# Patient Record
Sex: Female | Born: 1940 | Race: White | Hispanic: No | Marital: Married | Smoking: Former smoker
Health system: Southern US, Community
[De-identification: ages and names within clinical notes are randomized; demographics above are authoritative.]

## PROBLEM LIST (undated history)

## (undated) DIAGNOSIS — E7800 Pure hypercholesterolemia, unspecified: Secondary | ICD-10-CM

## (undated) DIAGNOSIS — I10 Essential (primary) hypertension: Secondary | ICD-10-CM

## (undated) HISTORY — PX: EYE SURGERY: SHX253

## (undated) HISTORY — PX: DILATION AND CURETTAGE OF UTERUS: SHX78

## (undated) HISTORY — PX: TONSILLECTOMY AND ADENOIDECTOMY: SUR1326

## (undated) HISTORY — PX: TUBAL LIGATION: SHX77

## (undated) HISTORY — PX: PYLOROPLASTY: SHX418

## (undated) HISTORY — PX: RETINAL DETACHMENT SURGERY: SHX105

## (undated) HISTORY — PX: CATARACT EXTRACTION, BILATERAL: SHX1313

## (undated) HISTORY — PX: OTHER SURGICAL HISTORY: SHX169

---

## 1992-05-12 ENCOUNTER — Inpatient Hospital Stay
Admission: AD | Admit: 1992-05-12 | Disposition: A | Discharge status: Home / Self Care | Payer: Self-pay | Source: Other Acute Inpatient Hospital

## 2005-08-01 ENCOUNTER — Ambulatory Visit
Admission: RE | Admit: 2005-08-01 | Disposition: A | Discharge status: Home / Self Care | Payer: Self-pay | Source: Ambulatory Visit

## 2006-08-30 ENCOUNTER — Ambulatory Visit
Admission: RE | Admit: 2006-08-30 | Disposition: A | Discharge status: Home / Self Care | Payer: Self-pay | Source: Ambulatory Visit

## 2007-08-21 ENCOUNTER — Ambulatory Visit
Admission: RE | Admit: 2007-08-21 | Disposition: A | Discharge status: Home / Self Care | Payer: Self-pay | Source: Ambulatory Visit

## 2007-09-11 ENCOUNTER — Ambulatory Visit
Admission: RE | Admit: 2007-09-11 | Disposition: A | Discharge status: Home / Self Care | Payer: Self-pay | Source: Ambulatory Visit

## 2009-09-06 ENCOUNTER — Ambulatory Visit
Admission: RE | Admit: 2009-09-06 | Disposition: A | Discharge status: Home / Self Care | Payer: Self-pay | Source: Ambulatory Visit

## 2011-05-30 HISTORY — PX: SKIN CANCER EXCISION: SHX779

## 2012-03-11 ENCOUNTER — Ambulatory Visit
Admission: RE | Admit: 2012-03-11 | Disposition: A | Discharge status: Home / Self Care | Payer: Self-pay | Source: Ambulatory Visit

## 2012-10-24 ENCOUNTER — Encounter (RURAL_HEALTH_CENTER): Payer: Self-pay | Admitting: Family Medicine

## 2012-10-24 DIAGNOSIS — Q400 Congenital hypertrophic pyloric stenosis: Secondary | ICD-10-CM

## 2012-10-24 DIAGNOSIS — M899 Disorder of bone, unspecified: Secondary | ICD-10-CM | POA: Insufficient documentation

## 2012-10-24 DIAGNOSIS — Z860100 Personal history of colon polyps, unspecified: Secondary | ICD-10-CM

## 2012-10-24 DIAGNOSIS — I10 Essential (primary) hypertension: Secondary | ICD-10-CM

## 2012-10-24 DIAGNOSIS — H40019 Open angle with borderline findings, low risk, unspecified eye: Secondary | ICD-10-CM

## 2012-10-24 DIAGNOSIS — M949 Disorder of cartilage, unspecified: Secondary | ICD-10-CM

## 2012-10-24 DIAGNOSIS — E785 Hyperlipidemia, unspecified: Secondary | ICD-10-CM

## 2012-10-24 DIAGNOSIS — Z01419 Encounter for gynecological examination (general) (routine) without abnormal findings: Secondary | ICD-10-CM

## 2012-10-24 DIAGNOSIS — J309 Allergic rhinitis, unspecified: Secondary | ICD-10-CM

## 2012-10-24 DIAGNOSIS — R059 Cough, unspecified: Secondary | ICD-10-CM

## 2012-10-24 HISTORY — DX: Cough, unspecified: R05.9

## 2012-10-24 HISTORY — DX: Disorder of cartilage, unspecified: M94.9

## 2012-10-24 HISTORY — DX: Disorder of bone, unspecified: M89.9

## 2012-10-24 HISTORY — DX: Encounter for gynecological examination (general) (routine) without abnormal findings: Z01.419

## 2012-10-24 HISTORY — DX: Congenital hypertrophic pyloric stenosis: Q40.0

## 2012-10-24 HISTORY — DX: Hyperlipidemia, unspecified: E78.5

## 2012-10-24 HISTORY — DX: Allergic rhinitis, unspecified: J30.9

## 2012-10-24 HISTORY — DX: Personal history of colonic polyps: Z86.010

## 2012-10-24 HISTORY — DX: Essential (primary) hypertension: I10

## 2012-10-24 HISTORY — DX: Open angle with borderline findings, low risk, unspecified eye: H40.019

## 2012-10-24 HISTORY — DX: Personal history of colon polyps, unspecified: Z86.0100

## 2013-06-18 ENCOUNTER — Encounter (INDEPENDENT_AMBULATORY_CARE_PROVIDER_SITE_OTHER): Payer: Self-pay | Admitting: Orthopaedic Surgery

## 2013-06-18 ENCOUNTER — Ambulatory Visit (INDEPENDENT_AMBULATORY_CARE_PROVIDER_SITE_OTHER): Payer: Medicare PPO | Admitting: Orthopaedic Surgery

## 2013-06-18 VITALS — BP 136/62 | HR 71 | Ht 63.0 in | Wt 150.0 lb

## 2013-06-18 DIAGNOSIS — M25511 Pain in right shoulder: Secondary | ICD-10-CM

## 2013-06-18 DIAGNOSIS — M25819 Other specified joint disorders, unspecified shoulder: Secondary | ICD-10-CM

## 2013-06-18 DIAGNOSIS — M67919 Unspecified disorder of synovium and tendon, unspecified shoulder: Secondary | ICD-10-CM

## 2013-06-18 MED ORDER — METHYLPREDNISOLONE ACETATE 80 MG/ML IJ SUSP
80.0000 mg | Freq: Once | INTRAMUSCULAR | Status: AC
Start: 2013-06-18 — End: 2013-06-18
  Administered 2013-06-18: 80 mg via INTRAMUSCULAR

## 2013-06-18 MED ORDER — LIDOCAINE HCL 1 % IJ SOLN
2.0000 mL | Freq: Once | INTRAMUSCULAR | Status: AC
Start: 2013-06-18 — End: 2013-06-18
  Administered 2013-06-18: 2 mL via INTRA_ARTICULAR

## 2013-06-18 NOTE — Progress Notes (Signed)
[facility]  Office Note    Demographics:      Date Time: 06/18/2013 9:59 AM  Patient Name: [name], [name]  DoB: 1941-03-10  Age: 73 y.o.   PCP: No primary provider on file.    History of Present Illness:      Chief Complaint   Patient presents with   . Shoulder Pain     New patient right shoulder pain.  NO known injury.  Pain started in July.  Had an injection in August for Bursitis that really didn't help.  Was having pain in the upper arm but now it is just in the shoulder area.  pain with movement.       Patient is a 73 y.o. female that presents in the office today with a history of right shoulder pain. The shoulder pain began with in July of this year after she washed her driveway with a pressure washer and had her R shoulder in the internal rotation position for a while, continued to have pain a month after and went to a medical clinic and was diagnosed with bursitis and had a steroid shot which did not help. Patient now states that her pain is much improved and only hurst in certain positions now. Patient had night pain at the beginning but does not now. Patient currently rates pain at 3 out of 10. On exam she has a classic impingement.    Patient is a 73 y.o. female that presents in the office today with a history of R shoulder pain.    Pain quality:     x  Aching       Burning        Cramping       Shooting       Stabbing  Pain Level:  Pain Score: 3-mild pain out of 10.  Pain Course:      Constant    x   Fluctuating       Improving       Worsening       Intermittent  Associated Symptoms:      Numbness       Weakness       Decreased range of motion          Stiffness    x   Pain with activity    x   Night pain       Locking and catching  Treatments:      Rest     x  Ice     x  Heat    x   Tylenol   x   Antiinflammatories      Medical History:      Past Medical History   Diagnosis Date   . Essential hypertension, benign 10/24/2012   . Other and unspecified hyperlipidemia 10/24/2012   . Open  angle with borderline findings, low risk 10/24/2012   . Disorder of bone and cartilage, unspecified 10/24/2012   . Personal history of colonic polyps 10/24/2012   . 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 Routine gynecological examination 10/24/2012   . Allergic rhinitis, cause unspecified 10/24/2012   . Cough 10/24/2012      Past Surgical History   Procedure Date   . Cataract extraction, bilateral      WITH LENS INSERTION   . Dilation and curettage of uterus      MISCARRIAGE   . Skin cancer excision 2013     BACK   . Pyloroplasty  PYLORIC STENOSIS, 72 WEEKS OLD   . Retinal detachment surgery    . Tonsillectomy and adenoidectomy    . Tubal ligation    . Tubal ligation reversal         (Not in a hospital admission)  No Known Allergies   History   Substance Use Topics   . Smoking status: Never Smoker    . Smokeless tobacco: Not on file   . Alcohol Use: Yes      Comment: OCCASIONAL      Family History   Problem Relation Age of Onset   . Heart disease Mother         Review of Systems:      Ten point review of systems:     Constitution: Negative  weight change, fatigue, activity change and fever.  HEENT: Negative hearing loss, sinus pressure, dental problems and visual disturbance.  Respiratory: Negative apnea, shortness of breath and cough.  CV: Negative chest pain, bilateral leg swelling and palpatations.  GI: Negative abdominal pain, constipation, gurd and diarrhea.  GU: Negative frequency, difficulty urinating, flank pain and urgency.  Skin: Negative rash and new wounds.  HEM: Negative bruises easily, and clots easily  Neuro:Negative numbness, headache and weakness.  XB:[id number] for R shoulder pain.   Psych: Negative confusion, hyperactivity, nervous.anxious and sleep disturbance.    Objective:      Vital Signs: BP 136/62  Pulse 71  Ht 1.6 m (5\' 3" )  Wt 68.04 kg (150 lb)  BMI 26.58 kg/m2     Neurologic: A/O x 3, + light touch sensation in the extremity. + DTR     Integumentary: Shoulder is clean, dry,  skin is intact. No signs of infection.  Swelling none.       Shoulder Exam: FROM in the elbow and cervical spine.  Pain with AROM  ROM: Forward Flexion 180             Abduction 180  No pain over the anterior capsule of the shoulder  No pain over the [facility] joint  Painful on external rotation with positive impingement sign  5/5 strength in the bicep and tricep  No pain over the long head of the bicep tendon  No pain over the trapezius                                       Imaging Review:      X-rays of the right shoulder were ordered in the office.  Images ordered in the office were AP, lateral and axial.  Indication is gradual onset of pain  I went over the images in the office with the patient.  My overall impression is normal shoulder x-ray.     Assessment:      1. Right shoulder pain         Plan:      The patient presented to the office today with  presents in the office today with right shoulder pain.  I discussed the clinical exam and imaging studies with the patient.  Based on the patient's presentation, examination and imaging, I believe the patient has , and would benefit from any of the following treatment options: cortisone injection and Physical Therapy.  The patient has already tried rest, ice, anti inflammatories and cortisone injection, and has opted to proceed forward with cortisone injection and Physical Therapy.    Procedural Note:  Patient's name and date of birth were verified, site of injection was verified, verified that all of the correct equipment was in the room, consent was completed.  Then the shoulder was prepped with hibiclens, prenumbed with ethyl chloride spray, then one cc lidocaine, one cc marcaine and 80 mg Depo Medrol were injected into the subacromial portion of the shoulder under sterile technique with no complications. The patient was monitored in the clinic for side effects of the injection, none were noted.  Patient tolerated the injection well.     No orders of the defined  types were placed in this encounter.      - MAR ACTION REPORT  (last 24 hrs)         ** No medications to display **        New Prescriptions    No medications on file     Modified Medications    No medications on file          SHOULDER INJECTION NOTE      Patient Name:  Twilla Khouri [[id number]] DOB: 03/09/1941  Date: 06/18/2013    Patient's name and date of birth were verified, site of injection was verified, verified that all of the correct equipment was in the room, consent was completed.  Then the right shoulder was prepped with hibiclens, prenumbed with ethyl chloride spray, then one cc lidocaine, one cc marcaine and 80 mg Depo Medrol were injected into the intra articular portion of the shoulder under sterile technique with no complications.    Dion Body, MD

## 2013-06-18 NOTE — Patient Instructions (Signed)
Please make sure you follow up with the prescribing physicians for any medications that you have elected to no longer take as prescribed.

## 2014-02-27 ENCOUNTER — Other Ambulatory Visit: Payer: Self-pay | Admitting: Obstetrics & Gynecology

## 2014-02-27 DIAGNOSIS — N63 Unspecified lump in unspecified breast: Secondary | ICD-10-CM

## 2014-03-09 ENCOUNTER — Other Ambulatory Visit: Payer: Self-pay | Admitting: Obstetrics & Gynecology

## 2014-03-09 ENCOUNTER — Ambulatory Visit
Admission: RE | Admit: 2014-03-09 | Discharge: 2014-03-09 | Disposition: A | Discharge status: Home / Self Care | Payer: Medicare PPO | Source: Ambulatory Visit | Attending: Obstetrics & Gynecology | Admitting: Obstetrics & Gynecology

## 2014-03-09 DIAGNOSIS — N63 Unspecified lump in unspecified breast: Secondary | ICD-10-CM

## 2016-09-12 ENCOUNTER — Emergency Department: Payer: Medicare PPO

## 2016-09-12 ENCOUNTER — Emergency Department
Admission: EM | Admit: 2016-09-12 | Discharge: 2016-09-12 | Disposition: A | Discharge status: Home / Self Care | Payer: Medicare PPO | Attending: Emergency Medicine | Admitting: Emergency Medicine

## 2016-09-12 DIAGNOSIS — R519 Headache, unspecified: Secondary | ICD-10-CM

## 2016-09-12 DIAGNOSIS — I10 Essential (primary) hypertension: Secondary | ICD-10-CM | POA: Insufficient documentation

## 2016-09-12 DIAGNOSIS — E876 Hypokalemia: Secondary | ICD-10-CM | POA: Insufficient documentation

## 2016-09-12 DIAGNOSIS — E785 Hyperlipidemia, unspecified: Secondary | ICD-10-CM | POA: Insufficient documentation

## 2016-09-12 DIAGNOSIS — R410 Disorientation, unspecified: Secondary | ICD-10-CM | POA: Insufficient documentation

## 2016-09-12 DIAGNOSIS — R51 Headache: Secondary | ICD-10-CM | POA: Insufficient documentation

## 2016-09-12 LAB — CBC AND DIFFERENTIAL
Basophils %: 0.8 % (ref 0.0–3.0)
Basophils Absolute: 0.1 10*3/uL (ref 0.0–0.3)
Eosinophils %: 1.1 % (ref 0.0–7.0)
Eosinophils Absolute: 0.1 10*3/uL (ref 0.0–0.8)
Hematocrit: 41.2 % (ref 36.0–48.0)
Hemoglobin: 13.9 gm/dL (ref 12.0–16.0)
Lymphocytes Absolute: 2.5 10*3/uL (ref 0.6–5.1)
Lymphocytes: 24.5 % (ref 15.0–46.0)
MCH: 33 pg (ref 28–35)
MCHC: 34 gm/dL (ref 32–36)
MCV: 99 fL (ref 80–100)
MPV: 7.6 fL (ref 6.0–10.0)
Monocytes Absolute: 0.8 10*3/uL (ref 0.1–1.7)
Monocytes: 8 % (ref 3.0–15.0)
Neutrophils %: 65.6 % (ref 42.0–78.0)
Neutrophils Absolute: 6.6 10*3/uL (ref 1.7–8.6)
PLT CT: 227 10*3/uL (ref 130–440)
RBC: 4.16 10*6/uL (ref 3.80–5.00)
RDW: 12 % (ref 11.0–14.0)
WBC: 10.1 10*3/uL (ref 4.0–11.0)

## 2016-09-12 LAB — BASIC METABOLIC PANEL
Anion Gap: 12.1 mMol/L (ref 7.0–18.0)
BUN / Creatinine Ratio: 26.4 Ratio (ref 10.0–30.0)
BUN: 23 mg/dL — ABNORMAL HIGH (ref 7–22)
CO2: 27.2 mMol/L (ref 20.0–30.0)
Calcium: 9.8 mg/dL (ref 8.5–10.5)
Chloride: 103 mMol/L (ref 98–110)
Creatinine: 0.87 mg/dL (ref 0.60–1.20)
EGFR: 65 mL/min/{1.73_m2} (ref 60–150)
Glucose: 113 mg/dL — ABNORMAL HIGH (ref 71–99)
Osmolality Calc: 282 mOsm/kg (ref 275–300)
Potassium: 3.3 mMol/L — ABNORMAL LOW (ref 3.5–5.3)
Sodium: 139 mMol/L (ref 136–147)

## 2016-09-12 LAB — CARBOXYHEMOGLOBIN: Carboxyhemoglobin: 1.5 % (ref 0.0–19.9)

## 2016-09-12 LAB — SEDIMENTATION RATE: Sed Rate: 3 mm/hr (ref 0–20)

## 2016-09-12 LAB — C-REACTIVE PROTEIN: C-Reactive Protein: 0.08 mg/dL (ref 0.02–0.80)

## 2016-09-12 MED ORDER — GADOBUTROL 1 MMOL/ML IV SOLN
10.0000 mL | Freq: Once | INTRAVENOUS | Status: AC | PRN
Start: 2016-09-12 — End: 2016-09-12
  Administered 2016-09-12: 17:00:00 10 mmol via INTRAVENOUS

## 2016-09-12 MED ORDER — ACETAMINOPHEN 325 MG PO TABS
650.0000 mg | ORAL_TABLET | Freq: Once | ORAL | Status: AC
Start: 2016-09-12 — End: 2016-09-12
  Administered 2016-09-12: 650 mg via ORAL

## 2016-09-12 MED ORDER — ONDANSETRON HCL 4 MG/2ML IJ SOLN
INTRAMUSCULAR | Status: AC
Start: 2016-09-12 — End: ?
  Filled 2016-09-12: qty 2

## 2016-09-12 MED ORDER — ACETAMINOPHEN 325 MG PO TABS
ORAL_TABLET | ORAL | Status: AC
Start: 2016-09-12 — End: ?
  Filled 2016-09-12: qty 2

## 2016-09-12 MED ORDER — CLONIDINE HCL 0.1 MG PO TABS
0.1000 mg | ORAL_TABLET | Freq: Once | ORAL | Status: AC
Start: 2016-09-12 — End: 2016-09-12
  Administered 2016-09-12: 18:00:00 0.1 mg via ORAL

## 2016-09-12 MED ORDER — VH POTASSIUM CHLORIDE CRYS ER 20 MEQ PO TBCR (WRAP)
40.0000 meq | EXTENDED_RELEASE_TABLET | Freq: Once | ORAL | Status: AC
Start: 2016-09-12 — End: 2016-09-12
  Administered 2016-09-12: 15:00:00 40 meq via ORAL

## 2016-09-12 MED ORDER — CLONIDINE HCL 0.1 MG PO TABS
ORAL_TABLET | ORAL | Status: AC
Start: 2016-09-12 — End: ?
  Filled 2016-09-12: qty 1

## 2016-09-12 MED ORDER — VH POTASSIUM CHLORIDE CRYS ER 20 MEQ PO TBCR (WRAP)
EXTENDED_RELEASE_TABLET | ORAL | Status: AC
Start: 2016-09-12 — End: ?
  Filled 2016-09-12: qty 2

## 2016-09-12 MED ORDER — ONDANSETRON HCL 4 MG/2ML IJ SOLN
4.0000 mg | Freq: Once | INTRAMUSCULAR | Status: DC
Start: 2016-09-12 — End: 2016-09-12

## 2016-09-12 MED ORDER — METHYLPREDNISOLONE SODIUM SUCC 125 MG IJ SOLR
40.0000 mg | Freq: Once | INTRAMUSCULAR | Status: DC
Start: 2016-09-12 — End: 2016-09-12

## 2016-09-12 MED ORDER — METHYLPREDNISOLONE SODIUM SUCC 40 MG IJ SOLR
INTRAMUSCULAR | Status: AC
Start: 2016-09-12 — End: ?
  Filled 2016-09-12: qty 1

## 2016-09-12 NOTE — ED Notes (Signed)
Per Dr Threasa Beards and Arlys John, PA pt does not need to stay to get her BP rechecked because she is leaving AMA.

## 2016-09-12 NOTE — ED Notes (Signed)
Pt to MRI via stretcher.

## 2016-09-12 NOTE — ED Triage Notes (Addendum)
Pt was coming from [place] yesterday and had a sudden onset bilateral temporal headache with forgetfulness and memory loss about why she was in [place]. Does not have any deficits at this time but bil eye redness and she does not appear to be blinking her right eye as frequently as the left.

## 2016-09-12 NOTE — ED Notes (Signed)
Pt returned from MRI °

## 2016-09-12 NOTE — ED Provider Notes (Signed)
I discussed the assessment and workup by Boykin Peek, PA. Patient is alert and oriented and not confused. I discussed disposition with the patient and her daughter and the patient adamantly declines LP or observation to safely treat her elevated blood pressure. CT and MRI/MRA of the brain were negative as well as inflammatory markers. Patient has not had a CVA, or temporal arteritis. Migrainous etiology was considered also. I explained to the patient may suffer a stroke, kidney failure or other negative consequence from untreated or inadequately treated hypertension. Patient still declines and I asked her to call her doctor to discuss her antihypertensive regimen tomorrow. She is not anxious to change her medications.     Emeterio Reeve, MD  09/12/16 [phone]

## 2016-09-12 NOTE — Discharge Instructions (Signed)
Headache, Unspecified    A number of things can cause headaches. The cause of your headache isn't clear. But it doesn't seem to be a sign of any serious illness.  You could have a tension headache or a migraine headache.  Stress can cause a tension headache. This can happen if you tense the muscles of your shoulders, neck, and scalp without knowing it. If this stress lasts long enough, you may develop a tension headache.  It is not clear why migraines occur, but certain things called" triggers" can raise the risk of having a migraine attack. Migraine triggers may include emotional stress or depression, or by hormone changes during the menstrual cycle. Other triggers include birth control pills and other medicines, alcohol or caffeine, foods with tyramine (such as aged cheese, wine), eyestrain, weather changes, missed meals, and lack of sleep or oversleeping.  Other causes of headache include:   Viral illness with high fever   Head injury with concussion   Sinus, ear, or throat infection   Dental pain and jaw joint (TMJ) pain  More serious but less common causes of headache include stroke, brain hemorrhage, brain tumor, meningitis, and encephalitis.  Home care  Follow these tips when taking care of yourself at home:   Don't drive yourself home if you were given pain medicine for your headache. Instead, have someone else drive you home. Try to sleep when you get home. You should feel much better when you wake up.   Apply heat to the back of your neck to ease a neck muscle spasm. Take care of a migraine headache by putting an ice pack on your forehead or at the base of your skull.   If you have nausea or vomiting, eat a light diet until your headache eases.   If you have a migraine headache, use sunglasses when in the daylight or around bright indoor lighting until your symptoms get better. Bright glaring light can make this type of headache worse.  Follow-up care  Follow up with your healthcare provider, or  as advised. Talk with your provider if you have frequent headaches. He or she can help figure out a treatment plan. By knowing the earliest signs of headache, and starting treatment right away, you may be able to stop the pain yourself.  When to seek medical advice  Call your healthcare provider right awayif any of these occur:   Your head pain suddenly gets worse after sexual intercourse or strenuous activity   Your head pain doesn't get better within 24 hours   You aren't able to keep liquids down (repeated vomiting)   Fever of 100.6F (38C) or higher, or as directed by your healthcare provider   Stiff neck   Extreme drowsiness, confusion, or fainting   Dizziness or dizziness with spinning sensation (vertigo)    Discharge Instructions: Taking Your Blood Pressure  Blood pressure is the force of blood as it moves from the heart through the blood vessels. You can take your own blood pressure reading using a digital monitor. Take readings as often as yourhealthcare providerinstructs. Take your readings each timein the same way, using the same arm. Here are guidelines for taking your blood pressure.  The American Heart Association (AHA) recommends purchasing a blood pressure monitor that is validated and approved by the Association for the Advancement of Medical Instrumentation, the [place] Hypertension Society, and the International Protocol for the Validation of Automated BP Measuring Devices. If the blood pressure monitor is for a senior adult, a  pregnant woman, or a child, make certain it is validated for use with such a population. For the most reliable readings, the AHA recommends an automatic, cuff-style, upper arm (bicep) monitor. The readings from finger and wrist monitors are not as reliable as the upper arm monitor.      Step 1. Relax    Wait at least a half hour after smoking,eating,or exercising. Do not drink coffee, tea, soda, or other caffeinated beverages before checking your blood  pressure.  Sit comfortably at a table. Place the monitor near you.  Rest for a few minutes before you begin.      Step 2. Wrap the cuff    Place your arm on the table,palm up. Put your arm in a positionthat islevelwith your heart. Wrap the cuff around your upper arm, about an inch above your elbow. It's best to wrap the cuff on bare skin,not over clothing.  Make sure your cuff fits. If it doesn'twrap around your upper arm,order a larger cuff. A cuff that is too large or too small can result in an inaccurate blood pressure reading.        Step 3. Inflate the cuff    Pump the cuff until the scale reads 200. If you have a self-inflating cuff,push the button that starts the pump.  The cuff will tighten,then loosen.  The numbers will change. When they stop changing, your blood pressure reading will appear.  If you get a reading that is too high or too low for you, relax for a few minutes. Then do the test again.    Step 4. Write down the results  Write down your blood pressure numbers. Loraine Leriche the date and time. Keep your results in one place,such as a notebook.  Remove the cuff from your arm. Turn off the machine.  Take the readings with you to your medical appointments.  If you start a new blood pressure medicine, or change a blood pressure medicine dose, note the day you started the new drug or dosage on your blood pressure recording sheet. This will help your healthcare provider monitor the effect of medication changes.    Date Last Reviewed: 09/23/2014   2000-2016 [employer] [address] [place], [place] [address]. All rights reserved. This information is not intended as a substitute for professional medical care. Always follow your healthcare professional's instructions.          Weakness in an arm or leg or one side of your face   You have trouble talking or seeing  Date Last Reviewed: 12/28/2014   2000-2016 [employer] [address], [place], [place] [address]. All  rights reserved. This information is not intended as a substitute for professional medical care. Always follow your healthcare professional's instructions.

## 2016-09-12 NOTE — ED Provider Notes (Signed)
[facility]  EMERGENCY DEPARTMENT  History and Physical Exam       Patient Name: [name], [name]  Encounter Date:  09/12/2016  Physician Assistant: Boykin Peek, PA-C  Attending Physician: Emeterio Reeve, MD  PCP: Blake Divine, MD  Patient DOB:  1940-08-31  MRN:  [id number]  Room:  S24/S24-A    History of Presenting Illness     Chief complaint: Headache and Memory Loss    HPI/ROS given by: Patient    [name] is a 76 y.o. female who presents with Her husband for evaluation of headache and transient confusion. She was in a car yesterday afternoon when she developed sudden bitemporal headache. She got home became nauseated and vomited twice. Subsequently the headache resolved, however she was transiently confused prior to the headache resolving. Her husband states that she could not remember what they had done over the course of the day. He denies that she was slurring speech, mixing up words, or having trouble recognizing her current surroundings, but simply could not remember what they had done over the course of the day. She went to bed feeling okay, when she woke up this morning has been experiencing a left occipital and bitemporal headache. No episodes of confusion today. No nausea or vomiting today. She denies any recent head trauma. Denies any recent illness or fever. Denies any history of migraine, CVA, TIA, or seizure.      Review of Systems     Review of Systems   Constitutional: Negative for chills and fever.   Respiratory: Negative for shortness of breath.    Cardiovascular: Negative for chest pain.   Gastrointestinal: Negative for abdominal pain.   Neurological: Negative for speech difficulty.   Psychiatric/Behavioral: Negative for agitation.   All other systems reviewed and are negative.       Allergies & Medications     Pt has No Known Allergies.    Current/Home Medications    CALCIUM ACETATE (PHOSLO) 667 MG CAPSULE    Take 1,334 mg by mouth daily.    LATANOPROST  (XALATAN) 0.005 % OPHTHALMIC SOLUTION    1 drop nightly.    MULTIPLE VITAMIN (MULTIVITAMIN) TABLET    Take 1 tablet by mouth daily.    ROSUVASTATIN (CRESTOR) 10 MG TABLET    Take 10 mg by mouth daily.    VALSARTAN-HYDROCHLOROTHIAZIDE (DIOVAN-HCT) 80-12.5 MG PER TABLET    Take 2 tablets by mouth daily.         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     Past Surgical History     Pt  has a past surgical history that includes Cataract extraction, bilateral; Dilation and curettage of uterus; Skin cancer excision (2013); Pyloroplasty; Retinal detachment surgery; Tonsillectomy and adenoidectomy; Tubal ligation; and TUBAL LIGATION REVERSAL.     Family History     The family history includes Heart disease in her mother.     Social History     Pt reports that she has never smoked. She has never used smokeless tobacco. She reports that she drinks alcohol. She reports that she does not use drugs.     Physical Exam     Blood pressure  196/72, pulse 74, temperature 99.7 F (37.6 C), temperature source Tympanic, resp. rate 18, height 1.6 m, weight 68 kg, SpO2 100 %.    Constitutional: Well developed, well nourished, active, in no apparent distress.  HENT:   Head: Normocephalic, atraumatic  Ears: No external lesions.  Nose: No external lesions. No epistaxis or drainage.  Eyes: PERRL. No scleral icterus. No conjunctival injection. EOMI.  Neck: Trachea is midline. No JVD. Normal range of motion. No apparent masses. Neck supple. No carotid bruits.  Cardiovascular: Regular rhythm, S1 normal  and S2 normal. No murmur heard.  Pulmonary/Chest: Effort normal. Lungs clear to auscultation bilaterally.   Abdominal: Soft, non-tender, non-distended. No masses.   Genitourinay/Anorectal: Defferred  Musculoskeletal: Normal range of motion of extremities. No deformity or apparent injury.   Neurological: Patient is alert, oriented 4. Cranial nerves II through XII intact with the exception of the fact that her right thigh does not blink in sync with the left eye, however she is able to close both eyes when instructed to without any apparent deficit. No pronator drift. Normal finger-to-nose. Normal gait. Motor power 5 out of 5 throughout upper and lower extremities.  Psychiatric: Affect is appropriate. There is no agitation.   Skin: Skin is warm, dry, well perfused. No rash noted. No cyanosis. No pallor.     Diagnostic Results     The results of the diagnostic studies below have been reviewed by myself:    Labs  Results     Procedure Component Value Units Date/Time    Carboxyhemoglobin [[id number]] Collected:  09/12/16 1800    Specimen:  Blood Updated:  09/12/16 1808     Carboxyhemoglobin 1.5 %     Lyme Ab Tot Rflx to WB IGG/IGM [[id number]] Collected:  09/12/16 1325    Specimen:  Blood Updated:  09/12/16 1800    CRP [[id number]] Collected:  09/12/16 1325    Specimen:  Plasma Updated:  09/12/16 1414     C-Reactive Protein 0.08 mg/dL     CBC and differential [[id number]] Collected:  09/12/16 1325    Specimen:  Blood from Blood Updated:  09/12/16 1406     WBC 10.1 K/cmm      RBC 4.16 M/cmm      Hemoglobin 13.9 gm/dL      Hematocrit 13.2 %      MCV 99 fL      MCH 33 pg      MCHC 34 gm/dL      RDW 44.0 %      PLT CT 227 K/cmm      MPV 7.6 fL      NEUTROPHIL % 65.6 %      Lymphocytes 24.5 %      Monocytes 8.0 %      Eosinophils % 1.1 %      Basophils % 0.8 %      Neutrophils Absolute 6.6 K/cmm      Lymphocytes Absolute 2.5 K/cmm      Monocytes Absolute 0.8 K/cmm      Eosinophils Absolute 0.1 K/cmm      BASO Absolute 0.1 K/cmm      ESR [[id number]] Collected:  09/12/16 1325    Specimen:  Blood Updated:  09/12/16 1359     Sed Rate 3 mm/hr     Basic Metabolic Panel [[id number]]  (Abnormal) Collected:  09/12/16 1325    Specimen:  Plasma Updated:  09/12/16 1357     Sodium 139 mMol/L      Potassium 3.3 (L)  mMol/L      Chloride 103 mMol/L      CO2 27.2 mMol/L      Calcium 9.8 mg/dL      Glucose 098 (H) mg/dL      Creatinine 1.19 mg/dL      BUN 23 (H) mg/dL      Anion Gap 14.7 mMol/L      BUN/Creatinine Ratio 26.4 Ratio      EGFR 65 mL/min/1.79m2      Osmolality Calc 282 mOsm/kg           Radiologic Studies  Mr Angiogram Head Wo Contrast    Result Date: 09/12/2016  Normal MRA of the head. ReadingStation:[id number]    Mr Angiogram Neck W Wo Contrast    Result Date: 09/12/2016  Normal MRA scan of the neck with and without contrast. ReadingStation:[id number]    Mri Brain Wo Contrast    Result Date: 09/12/2016  Abnormal MRI scan of the brain without contrast, with the findings as follows: 1. Mild chronic microvascular changes. 2. Mild generalized cortical atrophy. ReadingStation:[id number]    EKG: Sinus rhythm, no ischemic changes. Reviewed by Dr. Threasa Beards.      ED Meds     ED Medication Orders     Start Ordered     Status Ordering Provider    09/12/16 1822 09/12/16 1821  cloNIDine (CATAPRES) tablet 0.1 mg  Once in ED     Route: Oral  Ordered Dose: 0.1 mg     Last MAR action:  Given Hessie Dibble, Ryleah Miramontes    09/12/16 1436 09/12/16 1435  potassium chloride (K-DUR,KLOR-CON) CR tablet 40 mEq  Once in ED     Route: Oral  Ordered Dose: 40 mEq     Last MAR action:  Given Boykin Peek    09/12/16 1345 09/12/16 1344    Once in ED     Route: Intravenous  Ordered Dose: 4 mg     Discontinued Richardo Hanks MARIE    09/12/16 1345 09/12/16 1344    Once in ED     Route: Intravenous  Ordered Dose: 40 mg     Discontinued ROBERTSON, JENNIFER MARIE    09/12/16 1345 09/12/16 1344  acetaminophen (TYLENOL) tablet 650 mg  Once in ED     Route: Oral  Ordered Dose: 650 mg     Last MAR  action:  Given ROBERTSON, Merlene Laughter           ED Course and Medical Decision Making     Old medical records and nursing/triage notes were reviewed by myself      CONSULTS    2:27 PM- Paged Dr. Ether Griffins (neuro) for consultation to discuss the disposition of this patient. Recc MRI brain, MRA brain/neck.    DIAGNOSTIC CONSIDERATIONS    Subarachnoid hemorrhage, cervical artery dissection, temporal arteritis    MDM    5:56 PM-patient reassessed. Resting comfortably. Denies any current headache or other complaints. Neuro exam stable. Imaging results showed no acute finding. Discussed with neuro. Blood pressure is moderately elevated at 199/59. States she has taken her blood pressure meds today. Does not check her blood pressure frequently, but states it was checked yesterday during severe HA and was in the 130's range systolic. I recommended admission for observation, blood pressure control, but patient declines. Discussed diagnostic impression and negative imaging studies. Offered LP to complete workup of SAH given acuity of headache, but patient declines. Husband and daughter in law present for this conversation and in agreement. She prefers to monitor blood  pressure closely at home, follow up with PCP. Given the fact that her BP was normal at home yesterday, she does not want to adjust meds at this time. Understands to return here for new or worsening symptoms, including headache, focal weakness, or sensory change, slurred speech, visual change, confusion, or any blood pressures greater than 200/100.    I discussed this case with Emeterio Reeve, MD in the emergency department who also directly examined the patient and agrees with the assessment and treatment plan.     In addition to the above history, please see nursing notes. Allergies, meds, past medical, family, social hx, and the results of the diagnostic studies performed have been reviewed by myself.    This chart was generated by an EMR and may contain  errors or omissions not intended by the user.     Procedures / Critical Care     None     Diagnosis / Disposition     Clinical Impression  1. Acute nonintractable headache, unspecified headache type    2. Transient confusion    3. Hypokalemia    4. Accelerated hypertension        Disposition  ED Disposition     None          Follow up for Discharged Patients  No follow-up provider specified.    Prescriptions for Discharged Patients  New Prescriptions    No medications on file               Boykin Peek, [place]  09/12/16 1825       Emeterio Reeve, MD  09/12/16 [phone]

## 2016-09-13 LAB — ECG 12-LEAD
P Wave Axis: 45 deg
P-R Interval: 167 ms
Patient Age: 75 years
Q-T Interval(Corrected): 463 ms
Q-T Interval: 463 ms
QRS Axis: 32 deg
QRS Duration: 88 ms
T Axis: 35 years
Ventricular Rate: 60 //min

## 2016-09-13 LAB — LYME AB, TOTAL,REFLEX TO WESTERN BLOT (IGG & IGM): Lyme AB: NONREACTIVE

## 2016-10-03 ENCOUNTER — Emergency Department: Payer: Medicare PPO

## 2016-10-03 ENCOUNTER — Emergency Department
Admission: EM | Admit: 2016-10-03 | Discharge: 2016-10-03 | Disposition: A | Discharge status: Home / Self Care | Payer: Medicare PPO | Attending: Emergency Medicine | Admitting: Emergency Medicine

## 2016-10-03 DIAGNOSIS — M5412 Radiculopathy, cervical region: Secondary | ICD-10-CM | POA: Insufficient documentation

## 2016-10-03 LAB — ECG 12-LEAD
P Wave Axis: 46 deg
P-R Interval: 168 ms
Patient Age: 75 years
Q-T Interval(Corrected): 410 ms
Q-T Interval: 421 ms
QRS Axis: 18 deg
QRS Duration: 107 ms
T Axis: 39 years
Ventricular Rate: 57 //min

## 2016-10-03 LAB — I-STAT TROPONIN: Troponin I I-Stat: <0.02 ng/mL (ref 0.00–0.02)

## 2016-10-03 LAB — I-STAT CHEM 8 CARTRIDGE
Anion Gap I-Stat: 16 (ref 7.0–16.0)
BUN I-Stat: 23 mg/dL — ABNORMAL HIGH (ref 7–22)
Calcium Ionized I-Stat: 4.9 mg/dL (ref 4.35–5.10)
Chloride I-Stat: 100 mMol/L (ref 98–110)
Creatinine I-Stat: 0.6 mg/dL (ref 0.60–1.20)
EGFR: 89 mL/min/{1.73_m2} (ref 60–150)
Glucose I-Stat: 110 mg/dL — ABNORMAL HIGH (ref 71–99)
Hematocrit I-Stat: 42 % (ref 36.0–48.0)
Hemoglobin I-Stat: 14.3 gm/dL (ref 12.0–16.0)
Potassium I-Stat: 3.4 mMol/L — ABNORMAL LOW (ref 3.5–5.3)
Sodium I-Stat: 140 mMol/L (ref 136–147)
TCO2 I-Stat: 29 mMol/L (ref 24–29)

## 2016-10-03 LAB — CBC AND DIFFERENTIAL
Basophils %: 1.4 % (ref 0.0–3.0)
Basophils Absolute: 0.1 10*3/uL (ref 0.0–0.3)
Eosinophils %: 1.8 % (ref 0.0–7.0)
Eosinophils Absolute: 0.1 10*3/uL (ref 0.0–0.8)
Hematocrit: 42.8 % (ref 36.0–48.0)
Hemoglobin: 14.2 gm/dL (ref 12.0–16.0)
Lymphocytes Absolute: 2.2 10*3/uL (ref 0.6–5.1)
Lymphocytes: 28.5 % (ref 15.0–46.0)
MCH: 34 pg (ref 28–35)
MCHC: 33 gm/dL (ref 32–36)
MCV: 101 fL — ABNORMAL HIGH (ref 80–100)
MPV: 6.9 fL (ref 6.0–10.0)
Monocytes Absolute: 0.6 10*3/uL (ref 0.1–1.7)
Monocytes: 7.3 % (ref 3.0–15.0)
Neutrophils %: 61.2 % (ref 42.0–78.0)
Neutrophils Absolute: 4.7 10*3/uL (ref 1.7–8.6)
PLT CT: 288 10*3/uL (ref 130–440)
RBC: 4.25 10*6/uL (ref 3.80–5.00)
RDW: 12.7 % (ref 11.0–14.0)
WBC: 7.7 10*3/uL (ref 4.0–11.0)

## 2016-10-03 LAB — VH I-STAT TROPONIN NOTIFICATION

## 2016-10-03 LAB — VH I-STAT CHEM 8 NOTIFICATION

## 2016-10-03 MED ORDER — PREDNISONE 20 MG PO TABS
20.0000 mg | ORAL_TABLET | Freq: Every day | ORAL | 0 refills | Status: AC
Start: 2016-10-03 — End: 2016-10-08

## 2016-10-03 MED ORDER — ONDANSETRON HCL 8 MG PO TABS
4.0000 mg | ORAL_TABLET | Freq: Four times a day (QID) | ORAL | 0 refills | Status: DC | PRN
Start: 2016-10-03 — End: 2016-11-03

## 2016-10-03 MED ORDER — GABAPENTIN 300 MG PO CAPS
ORAL_CAPSULE | ORAL | 0 refills | Status: DC
Start: 2016-10-03 — End: 2016-11-03

## 2016-10-03 MED ORDER — PREDNISONE 20 MG PO TABS
60.0000 mg | ORAL_TABLET | Freq: Once | ORAL | Status: AC
Start: 2016-10-03 — End: 2016-10-03
  Administered 2016-10-03: 13:00:00 60 mg via ORAL

## 2016-10-03 MED ORDER — MORPHINE SULFATE 10 MG/ML IJ/IV SOLN (WRAP)
Status: AC
Start: 2016-10-03 — End: ?
  Filled 2016-10-03: qty 1

## 2016-10-03 MED ORDER — MORPHINE SULFATE 10 MG/ML IJ/IV SOLN (WRAP)
6.0000 mg | Freq: Once | Status: AC
Start: 2016-10-03 — End: 2016-10-03
  Administered 2016-10-03: 6 mg via INTRAMUSCULAR

## 2016-10-03 MED ORDER — OXYCODONE-ACETAMINOPHEN 5-325 MG PO TABS
1.0000 | ORAL_TABLET | Freq: Four times a day (QID) | ORAL | 0 refills | Status: DC | PRN
Start: 2016-10-03 — End: 2016-11-03

## 2016-10-03 MED ORDER — PREDNISONE 20 MG PO TABS
ORAL_TABLET | ORAL | Status: AC
Start: 2016-10-03 — End: ?
  Filled 2016-10-03: qty 3

## 2016-10-03 NOTE — ED Student (Signed)
EMERGENCY DEPARTMENT  PHYSICIAN NOTE    Patient Name: [name], [name]  Encounter Date:  10/03/2016  PCP: Blake Divine, MD  Patient DOB:  [birth year]  MRN:  [id number]  Room:  C13/C13-A  ED Physician: Harless Nakayama. Sherryll Burger, MD    DIAGNOSIS / DISPOSITION     Clinical Impression  1. Cervical radiculopathy        Disposition  ED Disposition     ED Disposition Condition Date/Time Comment    Discharge  Tue Oct 03, 2016  1:21 PM [name] discharge to home/self care.    Condition at disposition: Stable         Prescriptions  New Prescriptions    GABAPENTIN (NEURONTIN) 300 MG CAPSULE    1 tab PO day 1, 1 tab PO bid Day 2, then 1 tab tid    ONDANSETRON (ZOFRAN) 8 MG TABLET    Take 0.5 tablets (4 mg total) by mouth every 6 (six) hours as needed for Nausea.    OXYCODONE-ACETAMINOPHEN (PERCOCET) 5-325 MG PER TABLET    Take 1-2 tablets by mouth every 6 (six) hours as needed for Pain.for up to 15 doses    PREDNISONE (DELTASONE) 20 MG TABLET    Take 1 tablet (20 mg total) by mouth daily.for 5 days       HISTORY OF PRESENTING ILLNESS     Chief complaint: Arm Pain    HPI/ROS is limited by: none  HPI/ROS given by: patient      [name] is a 76 y.o. female who presents with L arm pain x 10 days. Patient describes the pain as a burning sensation in her forearm with a tingling sensation in her second and third digit. Patient reports that she has been managed by her PCP for this problem. Her PCP obtained a cervical spine XR which showed severe degenerative changes. Patient has an appointment to see Dr. Glade Stanford on 05/25, but reports her pain is intolerable. Pain increases with movement. PCP had given patient a prescription for Hydrocodone, but patient reports this gave her GI upset. Patient has also tried other OTC analgesics without relief. Patient denies neck pain, chest pain and shortness of breath. Pain is severe. Any type of movement worsens the pain. Has paresthesias distally as well denies any trauma.    REVIEW  OF SYSTEMS   Review of Systems   Constitutional: Negative for chills and fever.   HENT: Negative for congestion and sore throat.    Eyes: Negative for blurred vision.   Respiratory: Negative for cough and shortness of breath.    Cardiovascular: Negative for chest pain.   Gastrointestinal: Negative for abdominal pain, diarrhea, nausea and vomiting.   Genitourinary: Negative for dysuria.   Musculoskeletal: Negative for back pain and neck pain.   Skin: Negative for rash.   Neurological: Positive for tingling. Negative for dizziness, focal weakness, weakness and headaches.   Psychiatric/Behavioral: Negative.      PHYSICAL EXAM   Blood pressure 146/57, pulse (!) 54, temperature 97.7 F (36.5 C), temperature source Tympanic, resp. rate 18, height 1.6 m, weight 59.3 kg, SpO2 99 %.  The vital signs and the nurses note have been reviewed by me  Physical Exam   Constitutional: She is oriented to person, place, and time. She appears well-developed and well-nourished. No distress.   HENT:   Head: Normocephalic and atraumatic.   Eyes: Conjunctivae are normal. Pupils are equal, round, and reactive to light.   Neck: Normal range of motion and full  passive range of motion without pain. Neck supple. Muscular tenderness (L paravertebral tenderness at level of C6-C7) present. No spinous process tenderness present. Normal range of motion present.   Cardiovascular: Normal rate, regular rhythm, normal heart sounds and intact distal pulses.    Pulmonary/Chest: Effort normal and breath sounds normal. No respiratory distress.   Abdominal: Soft. There is no tenderness.   Musculoskeletal: Normal range of motion. She exhibits no edema.   Tenderness over the L dorsal forearm. No bony tenderness.   Neurological: She is alert and oriented to person, place, and time. She has normal strength. No sensory deficit.   Equal strength in bilateral UE.  No sensory deficit, but patient reports tingling in left pointer and ring fingers upon palpation.      Skin: Skin is warm and dry. No rash noted.   Psychiatric: She has a normal mood and affect. Her behavior is normal.   Nursing note and vitals reviewed.    ALLERGIES      Patient has no known allergies.    The patient's allergies were reviewed by the M.D.    MEDICATIONS     No current facility-administered medications for this encounter.     Current Outpatient Prescriptions:   .  amLODIPine (NORVASC) 5 MG tablet, , Disp: , Rfl:   .  calcium acetate (PHOSLO) 667 MG capsule, Take 1,334 mg by mouth daily., Disp: , Rfl:   .  latanoprost (XALATAN) 0.005 % ophthalmic solution, 1 drop nightly., Disp: , Rfl:   .  Multiple Vitamin (MULTIVITAMIN) tablet, Take 1 tablet by mouth daily., Disp: , Rfl:   .  rosuvastatin (CRESTOR) 10 MG tablet, Take 10 mg by mouth daily., Disp: , Rfl:   .  valsartan-hydrochlorothiazide (DIOVAN-HCT) 80-12.5 MG per tablet, Take 2 tablets by mouth daily. , Disp: , Rfl:   .  gabapentin (NEURONTIN) 300 MG capsule, 1 tab PO day 1, 1 tab PO bid Day 2, then 1 tab tid, Disp: 90 capsule, Rfl: 0  .  ondansetron (ZOFRAN) 8 MG tablet, Take 0.5 tablets (4 mg total) by mouth every 6 (six) hours as needed for Nausea., Disp: 20 tablet, Rfl: 0  .  oxyCODONE-acetaminophen (PERCOCET) 5-325 MG per tablet, Take 1-2 tablets by mouth every 6 (six) hours as needed for Pain.for up to 15 doses, Disp: 20 tablet, Rfl: 0  .  predniSONE (DELTASONE) 20 MG tablet, Take 1 tablet (20 mg total) by mouth daily.for 5 days, Disp: 5 tablet, Rfl: 0     The patient's medications were reviewed by the M.D.  PAST MEDICAL HISTORY     Past Medical History:   Diagnosis Date   . Allergic rhinitis, cause unspecified 10/24/2012   . 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 Cough 10/24/2012   . Disorder of bone and cartilage, unspecified 10/24/2012   . Essential hypertension, benign 10/24/2012   . Open angle with borderline findings, low risk 10/24/2012   . Other and unspecified hyperlipidemia 10/24/2012   . Personal history of colonic  polyps 10/24/2012   . Routine gynecological examination 10/24/2012       The patient's past medical history was reviewed by the M.D.  PAST SURGICAL HISTORY     Past Surgical History:   Procedure Laterality Date   . CATARACT EXTRACTION, BILATERAL      WITH LENS INSERTION   . DILATION AND CURETTAGE OF UTERUS      MISCARRIAGE   . PYLOROPLASTY      PYLORIC STENOSIS, 6 WEEKS  OLD   . RETINAL DETACHMENT SURGERY     . SKIN CANCER EXCISION  2013    BACK   . TONSILLECTOMY AND ADENOIDECTOMY     . TUBAL LIGATION     . TUBAL LIGATION REVERSAL         The patient's past surgical history was reviewed by the M.D.    FAMILY HISTORY     Family History   Problem Relation Age of Onset   . Heart disease Mother        SOCIAL HISTORY     Social History   Substance Use Topics   . Smoking status: Never Smoker   . Smokeless tobacco: Never Used   . Alcohol use Yes      Comment: OCCASIONAL       ORDERS PLACED AND MEDICATIONS GIVEN     Orders Placed This Encounter   Procedures   . XR Chest 2 Views   . CBC   . I-Stat Chem 8   . I-Stat Troponin   . i-Stat Troponin   . i-Stat Chem 8 CartrIDge   . ECG 12 lead (Stat) (Cardiac Related)       Medications   predniSONE (DELTASONE) tablet 60 mg (60 mg Oral Given 10/03/16 1318)   morphine injection 6 mg (6 mg Intramuscular Given 10/03/16 1320)       DIAGNOSTIC RESULTS       The results of the diagnostic studies below have been reviewed by myself:    Labs  Results     Procedure Component Value Units Date/Time    i-Stat Chem 8 CartrIDge [[id number]]  (Abnormal) Collected:  10/03/16 1133    Specimen:  Blood Updated:  10/03/16 1148     i-STAT Sodium 140 mMol/L      i-STAT Potassium 3.4 (L) mMol/L      i-STAT Chloride 100 mMol/L      TCO2, ISTAT 29 mMol/L      Ionized Ca, ISTAT 4.90 mg/dL      i-STAT Glucose 191 (H) mg/dL      i-STAT Creatinine 0.60 mg/dL      i-STAT BUN 23 (H) mg/dL      Anion Gap, ISTAT 47.8     EGFR 89 mL/min/1.64m2      i-STAT Hematocrit 42.0 %      i-STAT Hemoglobin 14.3 gm/dL     CBC  [[id number]]  (Abnormal) Collected:  10/03/16 1125    Specimen:  Blood from Blood Updated:  10/03/16 1145     WBC 7.7 K/cmm      RBC 4.25 M/cmm      Hemoglobin 14.2 gm/dL      Hematocrit 65.7 %      MCV 101 (H) fL      MCH 34 pg      MCHC 33 gm/dL      RDW 84.6 %      PLT CT 288 K/cmm      MPV 6.9 fL      NEUTROPHIL % 61.2 %      Lymphocytes 28.5 %      Monocytes 7.3 %      Eosinophils % 1.8 %      Basophils % 1.4 %      Neutrophils Absolute 4.7 K/cmm      Lymphocytes Absolute 2.2 K/cmm      Monocytes Absolute 0.6 K/cmm      Eosinophils Absolute 0.1 K/cmm      BASO Absolute 0.1 K/cmm  i-Stat Troponin [[id number]] Collected:  10/03/16 1131    Specimen:  Blood Updated:  10/03/16 1144     Trop I, ISTAT <0.02 ng/mL     I-Stat Chem 8 [[id number]] Collected:  10/03/16 1125    Specimen:  ISTAT Updated:  10/03/16 1131     I-STAT Notification Istat Notification    I-Stat Troponin [[id number]] Collected:  10/03/16 1045    Specimen:  ISTAT Updated:  10/03/16 1113     I-STAT Notification Istat Notification            Radiologic Studies  I have personally reviewed the images myself  Xr Chest 2 Views    Result Date: 10/03/2016  Normal chest. ReadingStation:[id number]        MDM / ED COURSE     Blood pressure 146/57, pulse (!) 54, temperature 97.7 F (36.5 C), temperature source Tympanic, resp. rate 18, height 1.6 m, weight 59.3 kg, SpO2 99 %.    I have personally reviewed the patient's past medical records.          The differential diagnosis includes, but is not limited to muscle spasm, herniated disk, vertebral fracture, DDD, spinal stenosis, carotid dissection, pharyngitis, retrophayrangeal abscess, Lemierre's syndrome, torticollis, neuropathy    Symptoms suggestive of cervical radiculopathy. I believe her pain is in the distribution of the radial nerve for C5-C6 dermatome. We will start her on prednisone, Neurontin analgesics and have her follow-up with neurosurgery as an outpatient. She was given intramuscular morphine with  significant improvement in her pain.    PROCEDURES     None.    EKG     The following EKG was obtained and independently interpreted by me. It shows:   Last EKG Result     Procedure Component Value Units Date/Time    ECG 12 lead (Stat) (Cardiac Related) [[id number]] Collected:  10/03/16 1103     Updated:  10/03/16 1104     Patient Age 60 years      Patient DOB 04-22-1941     Patient Height --     Patient Weight --     Interpretation Text --     Sinus rhythm  Low voltage, precordial leads  Compared to ECG 09/12/2016 15:08:58  Low QRS voltage now present       Physician Interpreter --     Ventricular Rate 57 //min      QRS Duration 107 ms      P-R Interval 168 ms      Q-T Interval 421 ms      Q-T Interval(Corrected) 410 ms      P Wave Axis 46 deg      QRS Axis 18 deg      T Axis 39 years                  Note:  This chart was generated by the Epic EMR system/ speech recognition and may contain inherent errors, including typographical, or omissions not intended by the user      Pleas Patricia, PA-S

## 2016-10-03 NOTE — ED Provider Notes (Signed)
EMERGENCY DEPARTMENT  PHYSICIAN NOTE    Patient Name: [name]  Encounter Date:  10/03/2016  PCP: Blake Divine, MD  Patient DOB:  1940-07-12  MRN:  [id number]  Room:  C13/C13-A  ED Physician: Harless Nakayama. Sherryll Burger, MD    DIAGNOSIS / DISPOSITION     Clinical Impression  1. Cervical radiculopathy        Disposition  ED Disposition     ED Disposition Condition Date/Time Comment    Discharge  Tue Oct 03, 2016  1:21 PM [name] discharge to home/self care.    Condition at disposition: Stable         Prescriptions  New Prescriptions    GABAPENTIN (NEURONTIN) 300 MG CAPSULE    1 tab PO day 1, 1 tab PO bid Day 2, then 1 tab tid    ONDANSETRON (ZOFRAN) 8 MG TABLET    Take 0.5 tablets (4 mg total) by mouth every 6 (six) hours as needed for Nausea.    OXYCODONE-ACETAMINOPHEN (PERCOCET) 5-325 MG PER TABLET    Take 1-2 tablets by mouth every 6 (six) hours as needed for Pain.for up to 15 doses    PREDNISONE (DELTASONE) 20 MG TABLET    Take 1 tablet (20 mg total) by mouth daily.for 5 days       HISTORY OF PRESENTING ILLNESS     Chief complaint: Arm Pain    HPI/ROS is limited by: none  HPI/ROS given by: patient      [name] is a 76 y.o. female who presents with L arm pain x 10 days. Patient describes the pain as a burning sensation in her forearm with a tingling sensation in her second and third digit. Patient reports that she has been managed by her PCP for this problem. Her PCP obtained a cervical spine XR which showed severe degenerative changes. Patient has an appointment to see Dr. Glade Stanford on 05/25, but reports her pain is intolerable. Pain increases with movement. PCP had given patient a prescription for Hydrocodone, but patient reports this gave her GI upset. Patient has also tried other OTC analgesics without relief. Patient denies neck pain, chest pain and shortness of breath. Pain is severe. Any type of movement worsens the pain. Has paresthesias distally as well denies any trauma.    REVIEW  OF SYSTEMS   Review of Systems   Constitutional: Negative for chills and fever.   HENT: Negative for congestion and sore throat.    Eyes: Negative for blurred vision.   Respiratory: Negative for cough and shortness of breath.    Cardiovascular: Negative for chest pain.   Gastrointestinal: Negative for abdominal pain, diarrhea, nausea and vomiting.   Genitourinary: Negative for dysuria.   Musculoskeletal: Negative for back pain and neck pain.   Skin: Negative for rash.   Neurological: Positive for tingling. Negative for dizziness, focal weakness, weakness and headaches.   Psychiatric/Behavioral: Negative.      PHYSICAL EXAM   Blood pressure 146/57, pulse (!) 54, temperature 97.7 F (36.5 C), temperature source Tympanic, resp. rate 18, height 1.6 m, weight 59.3 kg, SpO2 99 %.  The vital signs and the nurses note have been reviewed by me  Physical Exam   Constitutional: She is oriented to person, place, and time. She appears well-developed and well-nourished. No distress.   HENT:   Head: Normocephalic and atraumatic.   Eyes: Conjunctivae are normal. Pupils are equal, round, and reactive to light.   Neck: Normal range of motion and full  passive range of motion without pain. Neck supple. Muscular tenderness (L paravertebral tenderness at level of C6-C7) present. No spinous process tenderness present. Normal range of motion present.   Cardiovascular: Normal rate, regular rhythm, normal heart sounds and intact distal pulses.    Pulmonary/Chest: Effort normal and breath sounds normal. No respiratory distress.   Abdominal: Soft. There is no tenderness.   Musculoskeletal: Normal range of motion. She exhibits no edema.   Tenderness over the L dorsal forearm. No bony tenderness.   Neurological: She is alert and oriented to person, place, and time. She has normal strength. No sensory deficit.   Equal strength in bilateral UE.  No sensory deficit, but patient reports tingling in left pointer and ring fingers upon palpation.      Skin: Skin is warm and dry. No rash noted.   Psychiatric: She has a normal mood and affect. Her behavior is normal.   Nursing note and vitals reviewed.    ALLERGIES      Patient has no known allergies.    The patient's allergies were reviewed by the M.D.    MEDICATIONS     No current facility-administered medications for this encounter.     Current Outpatient Prescriptions:   .  amLODIPine (NORVASC) 5 MG tablet, , Disp: , Rfl:   .  calcium acetate (PHOSLO) 667 MG capsule, Take 1,334 mg by mouth daily., Disp: , Rfl:   .  latanoprost (XALATAN) 0.005 % ophthalmic solution, 1 drop nightly., Disp: , Rfl:   .  Multiple Vitamin (MULTIVITAMIN) tablet, Take 1 tablet by mouth daily., Disp: , Rfl:   .  rosuvastatin (CRESTOR) 10 MG tablet, Take 10 mg by mouth daily., Disp: , Rfl:   .  valsartan-hydrochlorothiazide (DIOVAN-HCT) 80-12.5 MG per tablet, Take 2 tablets by mouth daily. , Disp: , Rfl:   .  gabapentin (NEURONTIN) 300 MG capsule, 1 tab PO day 1, 1 tab PO bid Day 2, then 1 tab tid, Disp: 90 capsule, Rfl: 0  .  ondansetron (ZOFRAN) 8 MG tablet, Take 0.5 tablets (4 mg total) by mouth every 6 (six) hours as needed for Nausea., Disp: 20 tablet, Rfl: 0  .  oxyCODONE-acetaminophen (PERCOCET) 5-325 MG per tablet, Take 1-2 tablets by mouth every 6 (six) hours as needed for Pain.for up to 15 doses, Disp: 20 tablet, Rfl: 0  .  predniSONE (DELTASONE) 20 MG tablet, Take 1 tablet (20 mg total) by mouth daily.for 5 days, Disp: 5 tablet, Rfl: 0     The patient's medications were reviewed by the M.D.  PAST MEDICAL HISTORY     Past Medical History:   Diagnosis Date   . Allergic rhinitis, cause unspecified 10/24/2012   . 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 Cough 10/24/2012   . Disorder of bone and cartilage, unspecified 10/24/2012   . Essential hypertension, benign 10/24/2012   . Open angle with borderline findings, low risk 10/24/2012   . Other and unspecified hyperlipidemia 10/24/2012   . Personal history of colonic  polyps 10/24/2012   . Routine gynecological examination 10/24/2012       The patient's past medical history was reviewed by the M.D.  PAST SURGICAL HISTORY     Past Surgical History:   Procedure Laterality Date   . CATARACT EXTRACTION, BILATERAL      WITH LENS INSERTION   . DILATION AND CURETTAGE OF UTERUS      MISCARRIAGE   . PYLOROPLASTY      PYLORIC STENOSIS, 6 WEEKS  OLD   . RETINAL DETACHMENT SURGERY     . SKIN CANCER EXCISION  2013    BACK   . TONSILLECTOMY AND ADENOIDECTOMY     . TUBAL LIGATION     . TUBAL LIGATION REVERSAL         The patient's past surgical history was reviewed by the M.D.    FAMILY HISTORY     Family History   Problem Relation Age of Onset   . Heart disease Mother        SOCIAL HISTORY     Social History   Substance Use Topics   . Smoking status: Never Smoker   . Smokeless tobacco: Never Used   . Alcohol use Yes      Comment: OCCASIONAL       ORDERS PLACED AND MEDICATIONS GIVEN     Orders Placed This Encounter   Procedures   . XR Chest 2 Views   . CBC   . I-Stat Chem 8   . I-Stat Troponin   . i-Stat Troponin   . i-Stat Chem 8 CartrIDge   . ECG 12 lead (Stat) (Cardiac Related)       Medications   predniSONE (DELTASONE) tablet 60 mg (60 mg Oral Given 10/03/16 1318)   morphine injection 6 mg (6 mg Intramuscular Given 10/03/16 1320)       DIAGNOSTIC RESULTS       The results of the diagnostic studies below have been reviewed by myself:    Labs  Results     Procedure Component Value Units Date/Time    i-Stat Chem 8 CartrIDge [[id number]]  (Abnormal) Collected:  10/03/16 1133    Specimen:  Blood Updated:  10/03/16 1148     i-STAT Sodium 140 mMol/L      i-STAT Potassium 3.4 (L) mMol/L      i-STAT Chloride 100 mMol/L      TCO2, ISTAT 29 mMol/L      Ionized Ca, ISTAT 4.90 mg/dL      i-STAT Glucose 409 (H) mg/dL      i-STAT Creatinine 0.60 mg/dL      i-STAT BUN 23 (H) mg/dL      Anion Gap, ISTAT 81.1     EGFR 89 mL/min/1.56m2      i-STAT Hematocrit 42.0 %      i-STAT Hemoglobin 14.3 gm/dL     CBC  [[id number]]  (Abnormal) Collected:  10/03/16 1125    Specimen:  Blood from Blood Updated:  10/03/16 1145     WBC 7.7 K/cmm      RBC 4.25 M/cmm      Hemoglobin 14.2 gm/dL      Hematocrit 21.3 %      MCV 101 (H) fL      MCH 34 pg      MCHC 33 gm/dL      RDW 08.6 %      PLT CT 288 K/cmm      MPV 6.9 fL      NEUTROPHIL % 61.2 %      Lymphocytes 28.5 %      Monocytes 7.3 %      Eosinophils % 1.8 %      Basophils % 1.4 %      Neutrophils Absolute 4.7 K/cmm      Lymphocytes Absolute 2.2 K/cmm      Monocytes Absolute 0.6 K/cmm      Eosinophils Absolute 0.1 K/cmm      BASO Absolute 0.1 K/cmm  i-Stat Troponin [[id number]] Collected:  10/03/16 1131    Specimen:  Blood Updated:  10/03/16 1144     Trop I, ISTAT <0.02 ng/mL     I-Stat Chem 8 [[id number]] Collected:  10/03/16 1125    Specimen:  ISTAT Updated:  10/03/16 1131     I-STAT Notification Istat Notification    I-Stat Troponin [[id number]] Collected:  10/03/16 1045    Specimen:  ISTAT Updated:  10/03/16 1113     I-STAT Notification Istat Notification            Radiologic Studies  I have personally reviewed the images myself  Xr Chest 2 Views    Result Date: 10/03/2016  Normal chest. ReadingStation:[id number]        MDM / ED COURSE     Blood pressure 146/57, pulse (!) 54, temperature 97.7 F (36.5 C), temperature source Tympanic, resp. rate 18, height 1.6 m, weight 59.3 kg, SpO2 99 %.    I have personally reviewed the patient's past medical records.          The differential diagnosis includes, but is not limited to muscle spasm, herniated disk, vertebral fracture, DDD, spinal stenosis, carotid dissection, pharyngitis, retrophayrangeal abscess, Lemierre's syndrome, torticollis, neuropathy    Symptoms suggestive of cervical radiculopathy. I believe her pain is in the distribution of the radial nerve for C5-C6 dermatome. We will start her on prednisone, Neurontin analgesics and have her follow-up with neurosurgery as an outpatient. She was given intramuscular morphine with  significant improvement in her pain.    PROCEDURES     None.    EKG     The following EKG was obtained and independently interpreted by me. It shows:   Last EKG Result     Procedure Component Value Units Date/Time    ECG 12 lead (Stat) (Cardiac Related) [[id number]] Collected:  10/03/16 1103     Updated:  10/03/16 1104     Patient Age 76 years      Patient DOB Jun 24, 1940     Patient Height --     Patient Weight --     Interpretation Text --     Sinus rhythm  Low voltage, precordial leads  Compared to ECG 09/12/2016 15:08:58  Low QRS voltage now present       Physician Interpreter --     Ventricular Rate 57 //min      QRS Duration 107 ms      P-R Interval 168 ms      Q-T Interval 421 ms      Q-T Interval(Corrected) 410 ms      P Wave Axis 46 deg      QRS Axis 18 deg      T Axis 39 years                  Note:  This chart was generated by the Epic EMR system/ speech recognition and may contain inherent errors, including typographical, or omissions not intended by the user      Pleas Patricia, PA-S       Ermalene Postin, MD  10/03/16 [phone]

## 2016-10-03 NOTE — ED Notes (Signed)
IV attempt RAC 20g unsuccessful, blood work obtained and sent to lab.

## 2016-10-03 NOTE — Discharge Instructions (Signed)
Understanding Cervical Radiculopathy    Cervical radiculopathy is irritation or inflammation of a nerve root in the neck. It causes neck pain and other symptoms that may spread into the chest or down the arm. To understand this condition, it helps to understand the parts of the spine:   Vertebrae. These are bones that stack to form the spine. The cervical spine contains the 7 vertebrae in the neck.   Disks. These are soft pads of tissue between the vertebrae. They act as shock absorbers for the spine.   The spinal canal. This is a tunnel formed within the stacked vertebrae. The spinal cord runs through this canal.   Nerves. These branch off the spinal cord. As they leave the spinal canal, nerves pass through openings between the vertebrae. The nerve root is the part of the nerve that is closest to the spinal cord.  With cervical radiculopathy, nerve roots in the neck become irritated. This leads to pain and symptoms that can travel to the nerves that go from the spinal cord down the arms and into the torso.  What causes cervical radiculopathy?  Aging, injury, poor posture, and other issues can lead to problems in the neck. These problems may then irritate nerve roots. These include:   Damage to a disk in the cervical spine. The damaged disk may then press on nearby nerve roots.   Degeneration from wear and tear, and aging. This can lead to narrowing (stenosis) of the openings between the vertebrae. The narrowed openings press on nerve roots as they leave the spinal canal.   An unstable spine. This is when a vertebra slips forward. It can then press on a nerve root.  There are other, less common causes of pressure on nerves in the neck. These include infection, cysts, and tumors.  Symptoms of cervical radiculopathy  These include:   Neck pain   Pain, numbness, tingling, or weakness that travels down the arm   Loss of neck movement   Muscle spasms  Treatment for cervical radiculopathy  In most cases,  your healthcare provider will first try treatments that help relieve symptoms. These may include:   Prescription or over-the-counter pain medicines. These help relieve pain and swelling.   Cold packs. These help reduce pain.   Resting. This involves avoiding positions and activities that increase pain.   Neck brace (cervical collar). This can help relieve inflammation and pain.   Physical therapy, including exercises and stretches. This can help decrease pain and increase movement and function.   Shots of medicinesaround the nerve roots. This is done to help relieve symptoms for a time.  In some cases, your healthcare provider may advise surgery to fix the underlying problem. This depends on the cause, the symptoms, and how long the pain has lasted.  Possible complications  Over time, an irritated and inflamed nerve may become damaged. This may lead to long-lasting (permanent) numbness or weakness. If symptoms change suddenly or get worse, be sure to let your healthcare provider know.    When to call your healthcare provider  Call your healthcare provider right away if you have any of these:   New pain or pain that gets worse   New or increasing weakness, numbness, or tingling in your arm or hand   Bowel or bladder changes   Date Last Reviewed: 08/06/2014   2000-2017 [employer] [address], [place] [address]. All rights reserved. This information is not intended as a substitute for professional   medical care. Always follow your healthcare professional's instructions.

## 2016-10-03 NOTE — ED Triage Notes (Signed)
Pt presents to ED c/o left arm pain x10 days, pt states from the shoulder to the wrist is painful and the left 2nd and 3rd finger are numb. Pt denies injury. Pt denies chest pain, shortness of breath, or dizziness. Pt denies nausea or vomiting. Pt states that the pain is worse with movement. Pt seen by PCP on 4/27 and treated for shingles then was told she did not have shingles and was seen again by PCP and had a neck xray done. Pt was referred to neurologist but appointment is not until 5/25.

## 2016-10-20 ENCOUNTER — Other Ambulatory Visit: Payer: Self-pay | Admitting: Pulmonary Disease

## 2016-10-20 ENCOUNTER — Ambulatory Visit
Admission: RE | Admit: 2016-10-20 | Discharge: 2016-10-20 | Disposition: A | Discharge status: Home / Self Care | Payer: Self-pay | Source: Ambulatory Visit | Attending: Pulmonary Disease | Admitting: Pulmonary Disease

## 2016-10-20 DIAGNOSIS — R52 Pain, unspecified: Secondary | ICD-10-CM

## 2016-11-03 ENCOUNTER — Other Ambulatory Visit
Admission: RE | Admit: 2016-11-03 | Discharge: 2016-11-03 | Disposition: A | Discharge status: Home / Self Care | Payer: Medicare PPO | Source: Ambulatory Visit | Attending: General Practice | Admitting: General Practice

## 2016-11-03 ENCOUNTER — Encounter (INDEPENDENT_AMBULATORY_CARE_PROVIDER_SITE_OTHER): Payer: Self-pay

## 2016-11-03 ENCOUNTER — Ambulatory Visit (INDEPENDENT_AMBULATORY_CARE_PROVIDER_SITE_OTHER): Payer: Medicare PPO | Admitting: General Practice

## 2016-11-03 VITALS — BP 149/51 | HR 55 | Temp 97.7°F | Resp 14 | Ht 63.0 in | Wt 130.0 lb

## 2016-11-03 DIAGNOSIS — R197 Diarrhea, unspecified: Secondary | ICD-10-CM

## 2016-11-03 DIAGNOSIS — R110 Nausea: Secondary | ICD-10-CM

## 2016-11-03 LAB — VH AMB POCT CBC W/ AUTO DIFF
GR #, VH POCT: 4.2 10*3/uL
GR %, VH POCT: 60.5 % (ref 42–78)
HCT, VH POCT: 41.3 % (ref 33–48)
HGB, VH POCT: 12.8 g/dL (ref 11–16)
LY #, VH POCT: 2.4 10*3/uL
MCH, VH POCT: 31.2 pg — AB (ref 27–31)
MCHC, VH POCT: 31 g/dL — AB (ref 33–37)
MCV, VH POCT: 100.4 fL — AB (ref 81–99)
MO #, VH POCT: 0.3 10*3/uL
MONO %, VH POCT: 4.6 % (ref 0–10)
MPV, VH POCT: 7.6 fL
PLT, VH POCT: 363 10*3/uL (ref 120–450)
RBC, VH POCT: 4.11 10*6/uL (ref 3.2–5.1)
RDW, VH POCT: 13.9 %
VH POCT, Percent Lymphocyte: 34.9 % (ref 14–46)
WBC, VH POCT: 6.9 10*3/uL (ref 4.2–10)

## 2016-11-03 LAB — POCT CHEM8
Anion Gap, VH POCT: 16 mmol/L (ref 10–20)
BUN, VH POCT: 23 mg/dL — AB (ref 6–20)
Chloride, VH POCT: 97 mmol/L — AB (ref 98–112)
Creatinine, VH POCT: 0.5 mg/dL — AB (ref 0.6–1.1)
Glucose, VH POCT: 99 mg/dL (ref 70–99)
Ionized Calcium, VH POCT: 4.7 mg/dL (ref 4.35–5.1)
Potassium, VH POCT: 3.4 mmol/L — AB (ref 3.5–5.3)
Sodium, VH POCT: 140 mmol/L (ref 135–145)
Total Co2, VH POCT: 31 mmol/L — AB (ref 24–29)

## 2016-11-03 LAB — LIPASE: Lipase: 148 U/L — ABNORMAL HIGH (ref 8–78)

## 2016-11-03 MED ORDER — ONDANSETRON HCL 4 MG PO TABS
4.0000 mg | ORAL_TABLET | Freq: Every day | ORAL | 0 refills | Status: AC | PRN
Start: 2016-11-03 — End: 2017-11-03

## 2016-11-03 NOTE — Progress Notes (Signed)
Date Specimen Drawn: 11/03/2016  Time Specimen Drawn: 1132  Test(s) Ordered:  Cbc, I-stat, lipase  Patient's Tolerance: Good  Location Specimen Drawn: Left antecubital

## 2016-11-03 NOTE — Patient Instructions (Signed)
Uncertain Causes of Diarrhea (Adult)    Diarrhea is when stools are loose and watery. This can be caused by:   Viral infections   Bacterial infections   Food poisoning   Parasites   Irritable bowel syndrome (IBS)   Inflammatory bowel diseases such as ulcerative colitis, Crohn's disease, and celiac disease   Food intolerance, such as to lactose, the sugar found in milk and milk products   Reaction to medicines like antibiotics, laxatives, cancer drugs, and antacids  Along with diarrhea, you may also have:   Abdominal pain and cramping   Nausea and vomiting   Loss of bowel control   Fever and chills   Bloody stools  In some cases, antibiotics may help to treat diarrhea. You may have a stool sample test. This is done to see what is causing your diarrhea, and if antibiotics will help treat it. The results of a stool sample test may take up to 2 days. The healthcare provider may not give you antibiotics until he or she has the stool test results.  Diarrhea can cause dehydration. This is the loss of too much water and other fluids from the body. When this occurs, body fluid must be replaced. This can be done with oral rehydration solutions. Oral rehydration solutions are available at drugstores and grocery stores without a prescription.  Home care  Follow all instructions given by your healthcare provider. Rest at home for the next 24 hours, or until you feel better. Avoid caffeine, tobacco, and alcohol. These can make diarrhea, cramping, and pain worse.  If taking medicines:   Don't take over-the-counter diarrhea or nausea medicines unless your healthcare provider tells you to.   You may use acetaminophen or NSAID medicines like ibuprofen or naproxen to reduce pain and fever. Don't use these if you have chronic liver or kidney disease, or ever had a stomach ulcer or gastrointestinalbleeding. Don't use NSAID medicines if you are already taking one for another condition (like arthritis) or are on daily  aspirin therapy (such as for heart disease or after a stroke). Talk with your healthcare provider first.   If antibiotics were prescribed, be sure you take them until they are finished. Don't stop taking them even when you feel better. Antibiotics must be taken as a full course.  To prevent the spread of illness:   Remember that washing with soap and water and using alcohol-based sanitizer is the best way to prevent the spread of infection.   Clean the toilet after each use.   Wash your hands before eating.   Wash your hands before and after preparing food. Keep in mind that people with diarrhea or vomiting should not prepare food for others.   Wash your hands after using cutting boards, countertops, and knives that have been in contact with raw foods.   Wash and then peel fruits and vegetables.   Keep uncooked meats away from cooked and ready-to-eat foods.   Use a food thermometer when cooking. Cook poultry to at least 165F (74C). Cook ground meat (beef, veal, pork, lamb) to at least 160F (71C). Cook fresh beef, veal, lamb, and pork to at least 145F (63C).   Don't eat raw or undercooked eggs (poached or sunny side up), poultry, meat, or unpasteurized milk and juices.  Food and drinks  The main goal while treating vomiting or diarrhea is to prevent dehydration. This is done by taking small amounts of liquids often.   Keep in mind that liquids are more important   than food right now.   Drink only small amounts of liquids at a time.   Don't force yourself to eat, especially if you arehaving cramping, vomiting, or diarrhea. Don't eat large amounts at a time, even if you are hungry.   If you eat, avoid fatty, greasy, spicy, or fried foods.   Don't eat dairy foods or drink milk if you have diarrhea.These can makediarrhea worse.  During the first 24 hours you can try:   Oral rehydration solutions. Do not use sports drinks. They have too much sugar and not enough electrolytes.   Soft drinks without  caffeine   Ginger ale   Water (plain or flavored)   Decaf tea or coffee   Clear broth, consomm, or bouillon   Gelatin, popsicles, or frozen fruit juice bars  The second 24 hours, if you are feeling better, you can add:   Hot cereal, plain toast, bread, rolls, or crackers   Plain noodles, rice, mashed potatoes, chicken noodle soup, or rice soup   Unsweetened canned fruit (no pineapple)   Bananas  As you recover:   Limit fat intake to less than 15 grams per day. Don't eat margarine, butter, oils, mayonnaise, sauces, gravies, fried foods, peanut butter, meat, poultry, or fish.   Limit fiber. Don't eat raw or cooked vegetables, fresh fruits except bananas, or bran cereals.   Limit caffeine and chocolate.   Limit dairy.   Don't use spices or seasonings except salt.   Go back to your normal diet over time, as you feel better and your symptoms improve.   If the symptoms come back, go back to a simple diet or clear liquids.  Follow-up care  Follow up with your healthcare provider, or as advised. If a stool sample was taken or cultures were done, call the healthcare provider for the results as instructed.  Call 911  Call 911 if you have any of these symptoms:   Trouble breathing   Confusion   Extreme drowsiness or trouble walking   Loss of consciousness   Rapid heart rate   Chest pain   Stiff neck   Seizure  When to seek medical advice  Call your healthcare provider right away if any of these occur:   Abdominal pain that gets worse   Constant lower right abdominal pain   Continued vomiting and inability to keep liquids down   Diarrhea more than 5 times a day   Blood in vomit or stool   Dark urine or no urine for 8 hours, dry mouth and tongue, tiredness, weakness, or dizziness   Drowsiness   New rash   You don't get better in 2 to 3 days   Fever of 100.4F (38C) or higher that doesn't get lower with medicine  Date Last Reviewed: 05/31/2014   2000-2016 [employer] [address], [place] [address]. All rights reserved. This information is not intended as a substitute for professional medical care. Always follow your healthcare professional's instructions.

## 2016-11-03 NOTE — Progress Notes (Addendum)
Subjective:    Patient ID: [name] is a 76 y.o. female.    Diarrhea    This is a new problem. The current episode started in the past 7 days. The problem occurs 2 to 4 times per day. The problem has been unchanged. The stool consistency is described as watery. The patient states that diarrhea does not awaken her from sleep. Associated symptoms include weight loss. Pertinent negatives include no abdominal pain, bloating, chills, coughing, fever or headaches. Associated symptoms comments: Nausea x 2 weeks. Unintentional 10lbs weight loss. Exacerbated by: eating. Risk factors: denies recent abx use, hospital or nursing home visit or foreign travel.  Husband now has diarrhea. Treatments tried: immodium. The treatment provided moderate relief.       The following portions of the patient's history were reviewed and updated as appropriate: allergies, current medications, past family history, past medical history, past social history, past surgical history and problem list.    Review of Systems   Constitutional: Positive for weight loss. Negative for chills and fever.   Respiratory: Negative for cough and shortness of breath.    Cardiovascular: Negative for chest pain and palpitations.   Gastrointestinal: Positive for diarrhea. Negative for abdominal pain and bloating.   Genitourinary: Negative for difficulty urinating and dysuria.   Neurological: Negative for headaches.         Objective:    BP 149/51   Pulse (!) 55   Temp 97.7 F (36.5 C) (Oral)   Resp 14   Ht 1.6 m (5\' 3" )   Wt 59 kg (130 lb)   BMI 23.03 kg/m     Physical Exam   Constitutional: She is oriented to person, place, and time. She appears well-developed and well-nourished.   HENT:   Head: Normocephalic.   Eyes: Conjunctivae are normal.   Pulmonary/Chest: Effort normal.   Abdominal: Soft. She exhibits distension. She exhibits no mass. There is no tenderness. There is no rebound and no guarding. No hernia.   BS hyperactive x 4. No  hepatospleenomegaly. Negative psoas, obturator.   Neurological: She is alert and oriented to person, place, and time.   Skin: Skin is warm and dry.   Psychiatric: She has a normal mood and affect.   Nursing note and vitals reviewed.        Results     Procedure Component Value Units Date/Time    POCT I-STAT CHEM8 [[id number]]  (Abnormal) Collected:  11/03/16 1132     Updated:  11/03/16 1141     Sodium, VH POCT 140 mmol/L      Potassium, VH POCT 3.4 (A) mmol/L      Chloride, VH POCT 97 (A) mmol/L      Ionized Calcium, VH POCT 4.7 mg/dL      Total Co2, VH POCT 31 (A) mmol/L      Glucose, VH POCT 99 mg/dL      BUN, VH POCT 23 (A) mg/dL      Creatinine, VH POCT 0.5 (A) mg/dL      Anion Gap, VH POCT 16 mmol/L     VH POCT CBC W/ AUTO DIFF [[id number]]  (Abnormal) Collected:  11/03/16 1132     Updated:  11/03/16 1140     WBC, VH POCT 6.9 x10^3/uL      VH POCT, Percent Lymphocyte 34.9 %      MONO %, VH POCT 4.6 %      GR %, VH POCT 60.5 %      LY #,  VH POCT 2.4 x10^3/uL      MO #, VH POCT 0.3 x10^3/uL      GR #, VH POCT 4.2 x10^3/uL      RBC, VH POCT 4.11 x10^6/uL      HGB, VH POCT 12.8 g/dL      HCT, VH POCT 91.4 %      MCV, VH POCT 100.4 (A) fL      MCH, VH POCT 31.2 (A) pg      MCHC, VH POCT 31.0 (A) g/dL      RDW, VH POCT 78.2 %      PLT, VH POCT 363 x10^3/uL      MPV, VH POCT 7.6 fL           Assessment and Plan:       Wafaa was seen today for diarrhea.    Diagnoses and all orders for this visit:    Diarrhea in adult patient  -     C diff Toxin B Gene by DNA Amplification; Future  -     Examination for Ova and Parasites; Future    Nausea  -     US Abdomen Complete; Future  -     POCT I-STAT CHEM8  -     VH POCT CBC W/ AUTO DIFF  -     Lipase; Future    Other orders  -     ondansetron (ZOFRAN) 4 MG tablet; Take 1 tablet (4 mg total) by mouth daily as needed for Nausea.        Diarrhea - stool culture kit given to pt with education. BRAT diet and advance as tolerated. Replace fluid losses due to diarrhea.    Nausea - RX  ondansetron. Abdominal ultrasound, I-stat, CBC, Lipase.  Emphasized importance of primary care follow up due to symptoms and abnormal labs in urgent care. Stool cultures, abdominal ultrasound and lipase level pending.  Patient and husband expressed understanding and agreement with plan of care at time of discharge.   LIPASE LEVEL CAME BACK ELEVATED AT 148. Pt was reached by telephone at 1600 and advised of abnormal lab results and the importance of follow up with GI. She was given the contact number to [facility].     Cherlyn Roberts, NP  [facility]  11/03/2016  11:18 AM

## 2016-11-03 NOTE — Progress Notes (Signed)
Lipase  Sent.    [facility] No

## 2016-11-06 ENCOUNTER — Telehealth (INDEPENDENT_AMBULATORY_CARE_PROVIDER_SITE_OTHER): Payer: Self-pay

## 2016-11-06 NOTE — Telephone Encounter (Signed)
No anwser

## 2016-11-10 ENCOUNTER — Ambulatory Visit (HOSPITAL_BASED_OUTPATIENT_CLINIC_OR_DEPARTMENT_OTHER): Payer: Medicare PPO | Admitting: Anesthesiology

## 2016-11-10 ENCOUNTER — Encounter (HOSPITAL_BASED_OUTPATIENT_CLINIC_OR_DEPARTMENT_OTHER)
Admission: RE | Disposition: A | Discharge status: Home / Self Care | Payer: Self-pay | Source: Ambulatory Visit | Attending: Gastroenterology

## 2016-11-10 ENCOUNTER — Ambulatory Visit
Admission: RE | Admit: 2016-11-10 | Discharge: 2016-11-10 | Disposition: A | Discharge status: Home / Self Care | Payer: Medicare PPO | Source: Ambulatory Visit | Attending: Gastroenterology | Admitting: Gastroenterology

## 2016-11-10 ENCOUNTER — Encounter (HOSPITAL_BASED_OUTPATIENT_CLINIC_OR_DEPARTMENT_OTHER): Payer: Self-pay

## 2016-11-10 DIAGNOSIS — K449 Diaphragmatic hernia without obstruction or gangrene: Secondary | ICD-10-CM | POA: Insufficient documentation

## 2016-11-10 DIAGNOSIS — R634 Abnormal weight loss: Secondary | ICD-10-CM | POA: Insufficient documentation

## 2016-11-10 HISTORY — PX: EGD: SHX3789

## 2016-11-10 SURGERY — DONT USE, USE 1095-ESOPHAGOGASTRODUODENOSCOPY (EGD), DIAGNOSTIC
Anesthesia: Anesthesia MAC / Sedation | Site: Abdomen | Wound class: Clean Contaminated

## 2016-11-10 MED ORDER — SODIUM CHLORIDE 0.9 % IV SOLN
INTRAVENOUS | Status: DC
Start: 2016-11-10 — End: 2016-11-10

## 2016-11-10 MED ORDER — FENTANYL CITRATE (PF) 50 MCG/ML IJ SOLN (WRAP)
INTRAMUSCULAR | Status: DC | PRN
Start: 2016-11-10 — End: 2016-11-10
  Administered 2016-11-10 (×2): 25 ug via INTRAVENOUS

## 2016-11-10 MED ORDER — ONDANSETRON HCL 4 MG/2ML IJ SOLN
4.0000 mg | INTRAMUSCULAR | Status: DC | PRN
Start: 2016-11-10 — End: 2016-11-10

## 2016-11-10 MED ORDER — ONDANSETRON 4 MG PO TBDP
4.0000 mg | ORAL_TABLET | ORAL | Status: DC | PRN
Start: 2016-11-10 — End: 2016-11-10

## 2016-11-10 MED ORDER — FENTANYL CITRATE (PF) 50 MCG/ML IJ SOLN (WRAP)
INTRAMUSCULAR | Status: AC
Start: 2016-11-10 — End: ?
  Filled 2016-11-10: qty 5

## 2016-11-10 MED ORDER — PROPOFOL 200 MG/20ML IV EMUL
INTRAVENOUS | Status: DC | PRN
Start: 2016-11-10 — End: 2016-11-10
  Administered 2016-11-10: 40 mg via INTRAVENOUS
  Administered 2016-11-10: 20 mg via INTRAVENOUS

## 2016-11-10 MED ORDER — PROPOFOL 200 MG/20ML IV EMUL
INTRAVENOUS | Status: AC
Start: 2016-11-10 — End: ?
  Filled 2016-11-10: qty 20

## 2016-11-10 MED ORDER — LACTATED RINGERS IV SOLN
50.0000 mL/h | INTRAVENOUS | Status: DC
Start: 2016-11-10 — End: 2016-11-10

## 2016-11-10 MED ORDER — PEG 3350-KCL-NABCB-NACL-NASULF 236 G PO SOLR
4.0000 L | ORAL | Status: DC | PRN
Start: 2016-11-10 — End: 2016-11-10

## 2016-11-10 MED ORDER — FENTANYL CITRATE (PF) 50 MCG/ML IJ SOLN (WRAP)
INTRAMUSCULAR | Status: DC | PRN
Start: 2016-11-10 — End: 2016-11-10

## 2016-11-10 SURGICAL SUPPLY — 56 items
BASKET MED RETRIEV HEX 45X15 (Supply) IMPLANT
BRUSH CLEANING COMBINATION (Supply) ×1
BRUSH HEDGEHOG DUAL END (Supply) ×1 IMPLANT
BRUSH HEDGEHOG SINGLE END DISP (Supply) ×1 IMPLANT
BRUSH SINGLE END DISP (Supply) ×1
CAPSULE BRAVO (Supply) IMPLANT
CATH BALLOON DILATATION 5837 (Supply) IMPLANT
CATH BARRX 60 RFA FOCA (Supply) IMPLANT
CATH BARRX 90 RFA FOCAL (Supply) IMPLANT
CATH BARRX CHANNEL RFA ENDO (Supply) IMPLANT
CATH BARRX ULTRA-LONG RFA FOCA (Supply) IMPLANT
CATH GLD PROBE BICAP 7FX210CM (Supply) IMPLANT
CATH GLD PROBE BICAP 7FX300CM (Supply) IMPLANT
CATH GOLD PROBE 10FR (Supply) IMPLANT
CATH GOLD PROBE INJECTION 10F (Supply) IMPLANT
CLIP RESOLUTION 360 (Supply) IMPLANT
CONNECTOR QUICK PORT (Supply) ×2 IMPLANT
CRE BALLOON 10-12 DILATOR 5835 (Supply) IMPLANT
CRE BALLOON 12-15 DILATOR 5836 (Supply) IMPLANT
CRE BALLOON 18-20 DILATOR 5838 (Supply) IMPLANT
CRE BALLOON 6-8 DILAT 5833 (Supply) IMPLANT
CRE BALLOON 8-10 DILAT 5834 (Supply) IMPLANT
CRE ESOPH PYLORIC COL 10-12 (Supply) IMPLANT
CRE ESOPH PYLORIC COL 12-15 (Supply) IMPLANT
CRE ESOPH PYLORIC COL 15-18 (Supply) IMPLANT
CRE ESOPH PYLORIC COL 18-20 (Supply) IMPLANT
CRE ESOPH PYLORIC COL 6-8 (Supply) IMPLANT
CRE ESOPH PYLORIC COL 8-10 (Supply) IMPLANT
CRE PYLORIC COL 10-12 DIL 5847 (Supply) IMPLANT
CRE PYLORIC COL 12-15 DIL 5848 (Supply) IMPLANT
CRE PYLORIC COL 15-18 DIL 5849 (Supply) IMPLANT
CRE PYLORIC COL 8-10 DIL 5846 (Supply) IMPLANT
DEVICE GRASP RAPTOR 2.4X160 (Supply) ×2 IMPLANT
DEVICE STERIFLATE INFLATION (Supply) IMPLANT
DEVICE TALON GRASP 2.4X160 (Supply) IMPLANT
DIL CRE 18-20 PYL CLN 5850 (Supply) IMPLANT
DREAMWIRE STR .035 450CM (Supply) IMPLANT
FORCEP BIOPSY HOT RADIAL JAW 4 (Supply) IMPLANT
FORCEP BIOPSY RAD JAW 1333-40 (Supply) ×2 IMPLANT
FORCEP RAT TOOTH GRASP 2.4 (Supply) IMPLANT
FORCEP RESCUE RAT/ALL 8X230 (Supply) IMPLANT
KIT ENDOZIME BEDSIDE CARE 500 (Supply) ×2 IMPLANT
KIT PEG 18 FR (Supply) IMPLANT
KIT STD PEG 20FR PULL (Supply) IMPLANT
MARKER ENDOSCOPIC SPOT (Supply) IMPLANT
NDL INTERJECT SCLERO 25G (Supply) IMPLANT
OVERTUBE GUARD ESOPH 10.0-17.9 (Supply) IMPLANT
OVERTUBE GUARD ESOPH 8.6-10.00 (Supply) IMPLANT
PAD CLINCH ENDO TRASPORT (Supply) ×2 IMPLANT
PEG TUBE 18F GASTRO ENDOV (Supply) IMPLANT
PROBE SIDE FIRE (Supply) IMPLANT
PROBE STRAIGHT FIRE APC (Supply) IMPLANT
RESCUENET RETRIEVAL  2.5X230 (Supply) IMPLANT
SCISSOR ENZIZOR 2.6MMX236CM (Supply) IMPLANT
SNARE CAPTIVATOR ERM (Supply) IMPLANT
SPEEDBAND (Supply) IMPLANT

## 2016-11-10 NOTE — Anesthesia Preprocedure Evaluation (Signed)
Anesthesia Evaluation    AIRWAY    Mallampati: I    TM distance: >3 FB  Neck ROM: full  Mouth Opening:full   CARDIOVASCULAR    cardiovascular exam normal, regular and normal       DENTAL    no notable dental hx     PULMONARY    pulmonary exam normal and clear to auscultation     OTHER FINDINGS              Relevant Problems   No relevant active problems               Anesthesia Plan    ASA 2     MAC               (Informed consent obtained and the discussed possibility of anesthesia complications including but not limited to increased possibility of recall was discussed as well as need for possible jaw thrust maneuver and residual sore jaw.   Pt voiced understanding and wished to proceed with procedure.    Due to the nature of the electronic medical record, some data may not be completely accurate due to artifact, failure of electronic medical record devices, inaccurate data entry by other staff, etc.  )      intravenous induction   Detailed anesthesia plan: MAC        Post op pain management: per surgeon    informed consent obtained    ECG reviewed  pertinent labs reviewed             Signed by: [place] Taffie Eckmann 11/10/16 2:11 PM

## 2016-11-10 NOTE — Anesthesia Postprocedure Evaluation (Signed)
Anesthesia Post Evaluation    Patient: [name]    Procedures performed: Procedure(s):  EGD    Anesthesia type: MAC    Patient location:[facility]    Last vitals:   Vitals:    11/10/16 1532   BP: 186/65   Pulse:    Resp: 18   SpO2: 100%       Post pain: Patient not complaining of pain, continue current therapy      Mental Status:awake and alert     Respiratory Function: tolerating room air    Cardiovascular: stable    Nausea/Vomiting: patient not complaining of nausea or vomiting    Hydration Status: adequate    Post assessment: no apparent anesthetic complications and no reportable events

## 2016-11-10 NOTE — Brief Op Note (Signed)
See full endoscopy report in provation MD

## 2016-11-10 NOTE — Transfer of Care (Signed)
Anesthesia Transfer of Care Note    Patient: [name]    Last vitals:     HR 70  BP 192/62  RR 17  SpO2 97    Oxygen: Nasal Cannula     Mental Status:awake    Airway: Natural    Cardiovascular Status:  stable

## 2016-11-10 NOTE — Discharge Instr - AVS First Page (Signed)
[facility]  Upper Endoscopy Discharge Instructions    Dear Patient,  Thank you for allowing us to be a part of your health care experience.  We realize you may not fully recall your discharge care.  The following information is to guide you.    A.  After you leave the hospital:   1.  Due to the effects of the sedatives, you may feel tired for the remainder of today.   2.  DO NOT DRIVE OR OPERATE HAZARDOUS MACHINERY until tomorrow.   3.  Please rest and drink extra fluids today.  Avoid alcohol today.   4.  Resume your normal diet as tolerated.   5.  A sore throat or hoarse throat is normal.  Gargle with warm salt water or use a throat lozenge.   6.  You may feel bloated and pass air today.   7.  You may resume your normal activities (work) tomorrow.    B.  If you experience any of the following danger signs or symptoms, call your doctor immediately:  After business hours call [phone] for [facility] for physician guidance.   1.  Vomiting blood.   2. Passing  Blood in the stool or a change of stool consistency (black).   3.  Shortness of breath.   4.  New onset of pain, that does not subside or prevents you from normal daily activity.   5.  Redness or swelling at the IV insertion site that continues or worsens over 2-3 days. Initial warm compresses may be helpful.    All of us [facility] who supported you today during your procedure wish you a quick and comfortable recovery.  For any questions or concerns about your personal care, contact us at [phone].

## 2016-11-11 ENCOUNTER — Encounter (HOSPITAL_BASED_OUTPATIENT_CLINIC_OR_DEPARTMENT_OTHER): Payer: Self-pay | Admitting: Gastroenterology

## 2016-11-20 ENCOUNTER — Encounter: Payer: Self-pay | Admitting: Gastroenterology

## 2016-11-20 DIAGNOSIS — R112 Nausea with vomiting, unspecified: Secondary | ICD-10-CM

## 2016-11-21 ENCOUNTER — Encounter: Payer: Self-pay | Admitting: Gastroenterology

## 2016-11-21 DIAGNOSIS — R112 Nausea with vomiting, unspecified: Secondary | ICD-10-CM

## 2016-11-23 ENCOUNTER — Ambulatory Visit (INDEPENDENT_AMBULATORY_CARE_PROVIDER_SITE_OTHER)
Admission: RE | Admit: 2016-11-23 | Discharge: 2016-11-23 | Disposition: A | Discharge status: Home / Self Care | Payer: Medicare PPO | Source: Ambulatory Visit | Attending: Gastroenterology | Admitting: Gastroenterology

## 2016-11-23 DIAGNOSIS — R112 Nausea with vomiting, unspecified: Secondary | ICD-10-CM

## 2016-11-23 LAB — I-STAT CREATININE
Creatinine I-Stat: 0.6 mg/dL (ref 0.60–1.20)
EGFR: 89 mL/min/{1.73_m2} (ref 60–150)

## 2016-11-23 MED ORDER — IOHEXOL 350 MG/ML IV SOLN
100.0000 mL | Freq: Once | INTRAVENOUS | Status: AC | PRN
Start: 2016-11-23 — End: 2016-11-23
  Administered 2016-11-23: 10:00:00 100 mL via INTRAVENOUS

## 2019-01-13 ENCOUNTER — Encounter (INDEPENDENT_AMBULATORY_CARE_PROVIDER_SITE_OTHER): Payer: Self-pay

## 2019-01-13 ENCOUNTER — Ambulatory Visit (INDEPENDENT_AMBULATORY_CARE_PROVIDER_SITE_OTHER): Payer: Medicare PPO | Admitting: Physician Assistant

## 2019-01-13 VITALS — BP 180/61 | HR 62 | Temp 98.5°F | Resp 18 | Ht 63.0 in | Wt 140.0 lb

## 2019-01-13 DIAGNOSIS — W228XXA Striking against or struck by other objects, initial encounter: Secondary | ICD-10-CM

## 2019-01-13 DIAGNOSIS — I10 Essential (primary) hypertension: Secondary | ICD-10-CM

## 2019-01-13 DIAGNOSIS — S81812A Laceration without foreign body, left lower leg, initial encounter: Secondary | ICD-10-CM

## 2019-01-13 NOTE — Patient Instructions (Signed)
Old Laceration: Not Sutured  A laceration is a cut through the skin. This will usually require stitches if it is deep or spread open. However, if a laceration remains open for too long, the risk of infection increases. In your case, too much time has passed before coming for treatment. The danger of infection from stitching at this time is too high. That is why your wound was not stitched.  If the wound is spread open, it will heal by filling in from the bottom and sides. A wound that is not stitched may take 1 to 4 weeks to heal, depending on the size of the opening. You will probably have a visible scar. You can discuss revision of the scar with your healthcare provider at a later time.  Home care  The following guidelines will help you care for your laceration at home:  · Keep the wound clean and dry. If a bandage was applied and it becomes wet or dirty, replace it. Otherwise, leave it in place for the first 24 hours, then change it once a day or as directed.  · The healthcare provider may prescribe an antibiotic cream or ointment to prevent infection.   · If you are at high risk for infection, or the wound is very dirty, your provider may prescribe an oral antibiotic medicine to prevent infection. Don't stop using this medicine until you have finished it all, or the provider tells you to stop.  · The healthcare provider may also prescribe medicine for pain. Follow instructions for taking these medicines.  Clean the wound daily:  · After removing any bandage, wash the area with soap and water. Use a wet cotton swab to loosen and remove any blood or crust that forms.  · Talk with your healthcare provider before applying any antibiotic ointment to the wound. Reapply a fresh bandage.  · You may remove the bandage to shower as usual after the first 24 hours, but don't soak the area in water. This means no tub baths or swimming until the wound heals or your provider tells you it's OK.  Don't do activities that may  reinjure your wound.  · Check the wound daily for signs of infection listed below.  Follow-up care  Follow up with your healthcare provider, or as advised.  When to seek medical advice  Call your healthcare provider right away if any of these occur:  · Wound bleeding not controlled by direct pressure  · Signs of infection, including increasing pain in the wound, increasing wound redness or swelling, or pus or bad odor coming from the wound  · Fever of 100.4°F (38.ºC) or higher, chills, or as directed by your healthcare provider  · Wound edges reopen  · Wound changes colors  · Numbness around the wound   · Decreased movement around the injured area  [employer] last reviewed this educational content on 11/27/2015  © 2000-2020 [employer] [address], [place] [address]. All rights reserved. This information is not intended as a substitute for professional medical care. Always follow your healthcare professional's instructions.

## 2019-01-13 NOTE — Progress Notes (Signed)
Subjective:    Patient ID: [name] is a 78 y.o. female.    HPI  2 days ago, less than 48 hours ago, patient was in the backyard and her left lower extremity was "snagged" on a wire fence.  This fence was metal.  She presents today with concern and need for tetanus coverage.  She states her nurse neighbor bandaged the wound and that she has low concern about the wound itself.  There is no fever, purulence, erythema.  There is no suspected foreign body or fracture.  Patient did not fall or hit her head.  No other trials or medications    The following portions of the patient's history were reviewed and updated as appropriate: allergies, current medications, past family history, past medical history, past social history, past surgical history and problem list.    Review of Systems   Constitutional: Negative for fatigue and fever.   HENT: Negative for sore throat.    Respiratory: Negative for cough and shortness of breath.    Gastrointestinal: Negative for diarrhea, nausea and vomiting.   Musculoskeletal: Negative for arthralgias, gait problem and joint swelling.   Skin: Positive for wound. Negative for color change and rash.   Neurological: Negative for dizziness, weakness and numbness.   All other systems reviewed and are negative.         Objective:    BP 180/61    Pulse 62    Temp 98.5 F (36.9 C) (Tympanic)    Resp 18    Ht 1.6 m (5\' 3" )    Wt 63.5 kg (140 lb)    BMI 24.80 kg/m     Physical Exam  Vitals signs and nursing note reviewed.   Constitutional:       General: She is not in acute distress.     Appearance: She is well-developed. She is not diaphoretic.   HENT:      Head: Normocephalic and atraumatic.   Eyes:      General: No scleral icterus.        Right eye: No discharge.         Left eye: No discharge.      Conjunctiva/sclera: Conjunctivae normal.      Pupils: Pupils are equal, round, and reactive to light.   Neck:      Musculoskeletal: Normal range of motion and neck supple.   Cardiovascular:       Rate and Rhythm: Normal rate.   Pulmonary:      Effort: Pulmonary effort is normal. No respiratory distress.      Breath sounds: Normal breath sounds.   Abdominal:      Palpations: Abdomen is soft.   Musculoskeletal: Normal range of motion.   Skin:     General: Skin is warm and dry.      Capillary Refill: Capillary refill takes less than 2 seconds.      Coloration: Skin is not pale.      Findings: No erythema or rash.             Comments: H-shaped superficial degloving of the skin that has re-adhered to the original site with near approximation (short by 3mm or so) with no bleeding, suspect foreign body, or ability to pull skin back.    Neurological:      Mental Status: She is alert and oriented to person, place, and time.      Sensory: No sensory deficit.      Motor: No abnormal muscle tone.  Psychiatric:         Behavior: Behavior normal.           Assessment and Plan:       [name] was seen today for leg injury.    Diagnoses and all orders for this visit:    Laceration of left lower extremity, initial encounter  -     Td vaccine greater than or equal to 7yo preservative free IM    No ADR to Td injection after 11 minutes. Pt needed to leave so didn't want to stay full     Procedure Name: Laceration Repair  Indication: Reduced risk of infection and enhanced cosmesis. Risks and benefits discussed with patient before procedure started and informed consent obtained.  Anesthesia:   none  Skin Prep:  Area prepped and draped in usual sterile fashion. Wound was copiously irrigated with NS and chlorhexidine. No foreign body identified.   Repair:  Skin: repaired with nonstick dressing and Coban   Patient tolerated procedure well.   No complications.  Home Care:  Discussed wound care and signs/symptoms of infection.  May shower and gently pat wound dry.   Do not soak. No swimming.  Apply small amount of antibiotic ointment or warm soapy water and cover with bandage daily.  Follow up for wound check as needed --  immediately if signs and symptoms of infection such as increased warmth to site, fever 100.48F or greater, purulence, spreading erythema, increasing pain.     Advised rest and fluids; discussed appropriate OTC tx for use PRN.   Follow up with PCP/ED or return to clinic if new, worsening, or non-resolving symptoms. Call 911 if red-flag sx as discussed.   Patient/guardian expressed understanding and agreement with plan of care at time of discharge.   Advised patient to read entirety of AVS.    Patient and provider wearing masks today, only palpation with a gloved hands.    Patient Instructions     Old Laceration: Not Sutured  A laceration is a cut through the skin. This will usually require stitches if it is deep or spread open. However, if a laceration remains open for too long, the risk of infection increases. In your case, too much time has passed before coming for treatment. The danger of infection from stitching at this time is too high. That is why your wound was not stitched.  If the wound is spread open, it will heal by filling in from the bottom and sides. A wound that is not stitched may take 1 to 4 weeks to heal, depending on the size of the opening. You will probably have a visible scar.You can discuss revision of the scar with your healthcare provider at a later time.  Home care  The following guidelines will help you care for your laceration at home:   Keep the wound clean and dry. If a bandage was applied and it becomes wet or dirty, replace it. Otherwise, leave it in place for the first 24 hours, then change it once a day or as directed.   The healthcare provider may prescribe an antibiotic cream or ointment to prevent infection.   If you are at high risk for infection, or the wound isvery dirty, your provider may prescribe an oral antibiotic medicine to prevent infection. Don't stop using this medicine until you have finished it all, or the provider tells you to stop.   The healthcare provider  may also prescribe medicine for pain. Follow instructions for taking these  medicines.  Clean the wound daily:   After removing any bandage, wash the area with soap and water. Use a wet cotton swab to loosen and remove any blood or crust that forms.   Talk with your healthcare provider before applying any antibiotic ointment to the wound. Reapply a fresh bandage.   You may remove the bandage to shower as usual after the first 24 hours, but don't soak the area in water. This means no tub baths or swimming until the wound heals or your provider tells you it's OK. Don't do activities that may reinjure your wound.   Check the wound daily for signs of infection listed below.  Follow-up care  Follow up with your healthcare provider, or as advised.  When to seek medical advice  Call your healthcare provider right awayif any of these occur:   Wound bleeding not controlled by direct pressure   Signs of infection, including increasing pain in the wound, increasing wound redness or swelling, or pus or bad odor coming from the wound   Fever of100.56F (38.C)or higher, chills, or as directed by your healthcare provider   Wound edges reopen   Wound changes colors   Numbness around the wound   Decreased movement around the injured area  [employer]Well last reviewed this educational content on 11/27/2015   2000-2020 [employer] [place]. [address] [place], [place] [address]. All rights reserved. This information is not intended as a substitute for professional medical care. Always follow your healthcare professional's instructions.                    Nuala Alpha, PA  [facility]  01/13/2019  10:00 AM

## 2019-01-16 ENCOUNTER — Telehealth (INDEPENDENT_AMBULATORY_CARE_PROVIDER_SITE_OTHER): Payer: Self-pay

## 2019-01-16 NOTE — Telephone Encounter (Signed)
Spoke with patient, stated she is doing better since her recent visit

## 2019-02-18 ENCOUNTER — Emergency Department (HOSPITAL_COMMUNITY)
Admission: EM | Admit: 2019-02-18 | Discharge: 2019-02-19 | Disposition: A | Discharge status: Home / Self Care | Payer: Medicare PPO | Attending: Emergency Medicine | Admitting: Emergency Medicine

## 2019-02-18 ENCOUNTER — Encounter (HOSPITAL_COMMUNITY): Payer: Self-pay | Admitting: Emergency Medicine

## 2019-02-18 ENCOUNTER — Other Ambulatory Visit: Payer: Self-pay

## 2019-02-18 DIAGNOSIS — E876 Hypokalemia: Secondary | ICD-10-CM

## 2019-02-18 DIAGNOSIS — R410 Disorientation, unspecified: Secondary | ICD-10-CM | POA: Diagnosis present

## 2019-02-18 DIAGNOSIS — I10 Essential (primary) hypertension: Secondary | ICD-10-CM | POA: Diagnosis not present

## 2019-02-18 HISTORY — DX: Essential (primary) hypertension: I10

## 2019-02-18 HISTORY — DX: Pure hypercholesterolemia, unspecified: E78.00

## 2019-02-18 LAB — DIFFERENTIAL
Abs Immature Granulocytes: 0.02 10*3/uL (ref 0.00–0.07)
Basophils Absolute: 0.1 10*3/uL (ref 0.0–0.1)
Basophils Relative: 1 %
Eosinophils Absolute: 0.2 10*3/uL (ref 0.0–0.5)
Eosinophils Relative: 3 %
Immature Granulocytes: 0 %
Lymphocytes Relative: 44 %
Lymphs Abs: 2.9 10*3/uL (ref 0.7–4.0)
Monocytes Absolute: 0.6 10*3/uL (ref 0.1–1.0)
Monocytes Relative: 10 %
Neutro Abs: 2.8 10*3/uL (ref 1.7–7.7)
Neutrophils Relative %: 42 %

## 2019-02-18 LAB — CBC
HCT: 37.4 % (ref 36.0–46.0)
Hemoglobin: 13 g/dL (ref 12.0–15.0)
MCH: 33.9 pg (ref 26.0–34.0)
MCHC: 34.8 g/dL (ref 30.0–36.0)
MCV: 97.7 fL (ref 80.0–100.0)
Platelets: 271 10*3/uL (ref 150–400)
RBC: 3.83 MIL/uL — ABNORMAL LOW (ref 3.87–5.11)
RDW: 12.7 % (ref 11.5–15.5)
WBC: 6.5 10*3/uL (ref 4.0–10.5)
nRBC: 0 % (ref 0.0–0.2)

## 2019-02-18 LAB — PROTIME-INR
INR: 1.1 (ref 0.8–1.2)
Prothrombin Time: 14.1 seconds (ref 11.4–15.2)

## 2019-02-18 LAB — APTT: aPTT: 33 seconds (ref 24–36)

## 2019-02-18 LAB — CBG MONITORING, ED: Glucose-Capillary: 111 mg/dL — ABNORMAL HIGH (ref 70–99)

## 2019-02-18 MED ORDER — SODIUM CHLORIDE 0.9% FLUSH: 3.0000 mL | Freq: Once | INTRAVENOUS | Status: DC

## 2019-02-18 NOTE — ED Triage Notes (Addendum)
Pt was brought down from upstairs where she was visiting with her husband, staff upstairs states that patient is normally a/ox4, while upstairs, she walked out of her husbands room and became confused just prior to coming down to ED. Staff assisted patient back to her husbands room where she became oriented again but she was unable to recall events of why she left husband's room in the first place. Pt a/ox4 currently, speech clear, moves all limbs equally. No difficulty ambulating, speaking or moving limbs during episode per staff upstairs.

## 2019-02-18 NOTE — ED Provider Notes (Signed)
[facility] EMERGENCY DEPARTMENT Provider Note   CSN: [id number] Arrival date & time: 02/18/19  2247    History   Chief Complaint Chief Complaint  Patient presents with  . Altered Mental Status    HPI Christina Ewing is a 78 y.o. female.   The history is provided by the patient.  She has history of hypertension and hyperlipidemia and had an episode of confusion tonight.  She was with her husband who is a patient in the hospital here, and had fallen asleep.  The next thing she knew, she was in the hallway and confused.  Staff noted that she was walking out of the husband's room and was confused, but quickly became oriented.  Patient remembers being in the hallway and confused, but does not remember getting into the hallway.  She denies any headache or dizziness.  She denies chest pain or heaviness or tightness or pressure.  She denies nausea or vomiting.  She denies dysuria.  She is not on any new medications.  However, she is clearly under stress as her husband is hospitalized and they live in [place].  Past Medical History:  Diagnosis Date  . High cholesterol   . Hypertension     There are no active problems to display for this patient.   Past Surgical History:  Procedure Laterality Date  . EYE SURGERY       OB History   No obstetric history on file.      Home Medications    Prior to Admission medications   Not on File    Family History No family history on file.  Social History Social History   Tobacco Use  . Smoking status: Not on file  Substance Use Topics  . Alcohol use: Not on file  . Drug use: Not on file     Allergies   Patient has no known allergies.   Review of Systems Review of Systems  All other systems reviewed and are negative.    Physical Exam Updated Vital Signs BP (!) 189/76   Pulse 62   Temp 98.4 F (36.9 C) (Oral)   Resp 18   Ht 5\' 2"  (1.575 m)   Wt 63.5 kg   SpO2 98%   BMI 25.61 kg/m    Physical Exam Vitals signs and nursing note reviewed.    78 year old female, resting comfortably and in no acute distress. Vital signs are significant for elevated blood pressure98. Oxygen saturation is 98%, which is normal. Head is normocephalic and atraumatic. PERRLA, EOMI. Oropharynx is clear. Neck is nontender and supple without adenopathy or JVD.  There are no carotid bruits. Back is nontender and there is no CVA tenderness. Lungs are clear without rales, wheezes, or rhonchi. Chest is nontender. Heart has regular rate and rhythm without murmur. Abdomen is soft, flat, nontender without masses or hepatosplenomegaly and peristalsis is normoactive. Extremities have trace edema, full range of motion is present. Skin is warm and dry without rash. Neurologic: Mental status is normal, cranial nerves are intact, there are no motor or sensory deficits.  ED Treatments / Results  Labs (all labs ordered are listed, but only abnormal results are displayed) Labs Reviewed  CBC - Abnormal; Notable for the following components:      Result Value   RBC 3.83 (*)    All other components within normal limits  COMPREHENSIVE METABOLIC PANEL - Abnormal; Notable for the following components:   Potassium 3.2 (*)    Glucose, Bld  113 (*)    All other components within normal limits  URINALYSIS, ROUTINE W REFLEX MICROSCOPIC - Abnormal; Notable for the following components:   Hgb urine dipstick SMALL (*)    All other components within normal limits  I-STAT CHEM 8, ED - Abnormal; Notable for the following components:   Potassium 3.1 (*)    Glucose, Bld 108 (*)    All other components within normal limits  CBG MONITORING, ED - Abnormal; Notable for the following components:   Glucose-Capillary 111 (*)    All other components within normal limits  PROTIME-INR  APTT  DIFFERENTIAL    EKG EKG Interpretation  Date/Time:  Tuesday February 18 2019 23:05:49 EDT Ventricular Rate:  61 PR Interval:  176  QRS Duration: 78 QT Interval:  446 QTC Calculation: 448 R Axis:   44 Text Interpretation:  Normal sinus rhythm Cannot rule out Anterior infarct , age undetermined Abnormal ECG No old tracing to compare Confirmed by Delora Fuel ([id number]) on 02/18/2019 11:10:43 PM   Radiology Ct Head Wo Contrast  Result Date: 02/19/2019 CLINICAL DATA:  78 year old female altered mental status. EXAM: CT HEAD WITHOUT CONTRAST TECHNIQUE: Contiguous axial images were obtained from the base of the skull through the vertex without intravenous contrast. COMPARISON:  None. FINDINGS: Brain: There is mild age-related atrophy and moderate chronic microvascular ischemic changes. There is no acute intracranial hemorrhage. No mass effect or shift. No extra-axial fluid collection. Vascular: No hyperdense vessel or unexpected calcification. Skull: Normal. Negative for fracture or focal lesion. Sinuses/Orbits: The visualized paranasal sinuses and mastoid air cells are clear. Right scleral band. Other: None IMPRESSION: 1. No acute intracranial hemorrhage. 2. Age-related atrophy and chronic microvascular ischemic changes. Electronically Signed   By: Anner Crete M.D.   On: 02/19/2019 01:10    Procedures Procedures   Medications Ordered in ED Medications  sodium chloride flush (NS) 0.9 % injection 3 mL (3 mLs Intravenous Not Given 02/18/19 2340)     Initial Impression / Assessment and Plan / ED Course  I have reviewed the triage vital signs and the nursing notes.  Pertinent labs & imaging results that were available during my care of the patient were reviewed by me and considered in my medical decision making (see chart for details).  Episode of confusion which occurred as the patient woke up.  This actually sounds like it probably was sleepwalking.  Normal exam.  ECG shows no acute changes.  CBC is normal.  Glucose 111.  Will check CT of head and urinalysis.  Metabolic panel is pending.  She has no prior records in the [facility]  health system.  On care everywhere, it is noted she had MRI of brain and MRA of head and neck in 2018 with only findings of note were mild cortical atrophy and mild chronic microvascular changes.  CT shows no acute process.  Labs are significant only for mild hypokalemia.  She is given a dose of oral potassium.  No evidence of any significant neurologic event.  She is discharged with prescription for potassium and is to follow-up with her primary care provider when she gets back to [place].  Final Clinical Impressions(s) / ED Diagnoses   Final diagnoses:  Confusion and disorientation  Hypokalemia  Elevated blood pressure reading with diagnosis of hypertension    ED Discharge Orders         Ordered    potassium chloride SA (K-DUR) 20 MEQ tablet  2 times daily     02/19/19 0120  Dione Booze, MD 02/19/19 [phone]

## 2019-02-18 NOTE — ED Notes (Signed)
CBG 111 

## 2019-02-19 ENCOUNTER — Emergency Department (HOSPITAL_COMMUNITY): Payer: Medicare PPO

## 2019-02-19 LAB — I-STAT CHEM 8, ED
BUN: 23 mg/dL (ref 8–23)
Calcium, Ion: 1.2 mmol/L (ref 1.15–1.40)
Chloride: 101 mmol/L (ref 98–111)
Creatinine, Ser: 0.6 mg/dL (ref 0.44–1.00)
Glucose, Bld: 108 mg/dL — ABNORMAL HIGH (ref 70–99)
HCT: 40 % (ref 36.0–46.0)
Hemoglobin: 13.6 g/dL (ref 12.0–15.0)
Potassium: 3.1 mmol/L — ABNORMAL LOW (ref 3.5–5.1)
Sodium: 139 mmol/L (ref 135–145)
TCO2: 27 mmol/L (ref 22–32)

## 2019-02-19 LAB — URINALYSIS, ROUTINE W REFLEX MICROSCOPIC
Bacteria, UA: NONE SEEN
Bilirubin Urine: NEGATIVE
Glucose, UA: NEGATIVE mg/dL
Ketones, ur: NEGATIVE mg/dL
Leukocytes,Ua: NEGATIVE
Nitrite: NEGATIVE
Protein, ur: NEGATIVE mg/dL
Specific Gravity, Urine: 1.028 (ref 1.005–1.030)
pH: 5 (ref 5.0–8.0)

## 2019-02-19 LAB — COMPREHENSIVE METABOLIC PANEL
ALT: 19 U/L (ref 0–44)
AST: 28 U/L (ref 15–41)
Albumin: 4.3 g/dL (ref 3.5–5.0)
Alkaline Phosphatase: 50 U/L (ref 38–126)
Anion gap: 11 (ref 5–15)
BUN: 20 mg/dL (ref 8–23)
CO2: 26 mmol/L (ref 22–32)
Calcium: 9.4 mg/dL (ref 8.9–10.3)
Chloride: 102 mmol/L (ref 98–111)
Creatinine, Ser: 0.63 mg/dL (ref 0.44–1.00)
GFR calc Af Amer: >60 mL/min (ref >60)
GFR calc non Af Amer: >60 mL/min (ref >60)
Glucose, Bld: 113 mg/dL — ABNORMAL HIGH (ref 70–99)
Potassium: 3.2 mmol/L — ABNORMAL LOW (ref 3.5–5.1)
Sodium: 139 mmol/L (ref 135–145)
Total Bilirubin: 0.8 mg/dL (ref 0.3–1.2)
Total Protein: 6.7 g/dL (ref 6.5–8.1)

## 2019-02-19 MED ORDER — POTASSIUM CHLORIDE CRYS ER 20 MEQ PO TBCR
40.0000 meq | EXTENDED_RELEASE_TABLET | Freq: Once | ORAL | Status: AC
  Administered 2019-02-19: 01:00:00 40 meq via ORAL
  Filled 2019-02-19: qty 2

## 2019-02-19 MED ORDER — POTASSIUM CHLORIDE CRYS ER 20 MEQ PO TBCR: 20.0000 meq | EXTENDED_RELEASE_TABLET | Freq: Two times a day (BID) | ORAL | 0 refills | Status: AC

## 2019-02-19 NOTE — ED Notes (Signed)
Discharge instructions discussed with pt. Pt verbalized understanding with no questions at this time. Pt to go home with daughter.  °

## 2019-02-19 NOTE — Discharge Instructions (Addendum)
Your evaluation did not show any serious problems other than low potassium.  You are being given a prescription for potassium pills to take for the next 5 days.  Please follow-up with your primary care provider when you get back home.

## 2019-04-05 ENCOUNTER — Encounter (INDEPENDENT_AMBULATORY_CARE_PROVIDER_SITE_OTHER): Payer: Self-pay

## 2019-04-05 ENCOUNTER — Ambulatory Visit (INDEPENDENT_AMBULATORY_CARE_PROVIDER_SITE_OTHER): Payer: Medicare PPO

## 2019-04-05 VITALS — HR 89 | Temp 101.5°F | Resp 19 | Ht 63.0 in | Wt 140.0 lb

## 2019-04-05 DIAGNOSIS — Z5321 Procedure and treatment not carried out due to patient leaving prior to being seen by health care provider: Secondary | ICD-10-CM

## 2019-04-05 NOTE — Progress Notes (Signed)
Patient's husband advised to go to ED, and patient decided to not seek treatment at [facility] today either.

## 2019-04-07 ENCOUNTER — Emergency Department: Payer: Medicare PPO

## 2019-04-07 ENCOUNTER — Emergency Department
Admission: EM | Admit: 2019-04-07 | Discharge: 2019-04-07 | Disposition: A | Discharge status: Home / Self Care | Payer: Medicare PPO | Attending: Emergency Medicine | Admitting: Emergency Medicine

## 2019-04-07 DIAGNOSIS — R110 Nausea: Secondary | ICD-10-CM | POA: Insufficient documentation

## 2019-04-07 DIAGNOSIS — U071 COVID-19: Secondary | ICD-10-CM | POA: Insufficient documentation

## 2019-04-07 DIAGNOSIS — B349 Viral infection, unspecified: Secondary | ICD-10-CM

## 2019-04-07 DIAGNOSIS — I10 Essential (primary) hypertension: Secondary | ICD-10-CM

## 2019-04-07 LAB — VH URINALYSIS WITH MICROSCOPIC
Bilirubin, UA: NEGATIVE
Glucose, UA: NEGATIVE mg/dL
Ketones UA: 5 mg/dL
Leukocyte Esterase, UA: NEGATIVE Leu/uL
Nitrite, UA: NEGATIVE
Protein, UR: 100 mg/dL — AB
RBC, UA: 2 /hpf (ref 0–5)
Squam Epithel, UA: 1 /hpf (ref 0–2)
Urine Specific Gravity: 1.025 (ref 1.001–1.040)
Urobilinogen, UA: 2 mg/dL — AB
WBC, UA: 4 /hpf (ref 0–4)
pH, Urine: 5 pH (ref 5.0–8.0)

## 2019-04-07 LAB — VH APTIMA SARS-COV-2 ASSAY (PANTHER SYSTEM)(TM)
Aptima SARS-CoV-2: POSITIVE — CR
Date of Onset: 20201109
Does patient reside in a congregate care setting?: NEGATIVE
Is patient employed in a healthcare setting?: NEGATIVE
Is the patient hospitalized because of this condition?: NEGATIVE
Is the patient pregnant?: NEGATIVE

## 2019-04-07 LAB — BASIC METABOLIC PANEL
Anion Gap: 21.1 mMol/L — ABNORMAL HIGH (ref 7.0–18.0)
BUN / Creatinine Ratio: 29.7 Ratio (ref 10.0–30.0)
BUN: 22 mg/dL (ref 7–22)
CO2: 19.1 mMol/L — ABNORMAL LOW (ref 20.0–30.0)
Calcium: 8.9 mg/dL (ref 8.5–10.5)
Chloride: 100 mMol/L (ref 98–110)
Creatinine: 0.74 mg/dL (ref 0.60–1.20)
EGFR: 78 mL/min/{1.73_m2} (ref 60–150)
Glucose: 111 mg/dL — ABNORMAL HIGH (ref 71–99)
Osmolality Calculated: 278 mOsm/kg (ref 275–300)
Potassium: 3.2 mMol/L — ABNORMAL LOW (ref 3.5–5.3)
Sodium: 137 mMol/L (ref 136–147)

## 2019-04-07 LAB — HEPATIC FUNCTION PANEL
ALT: 21 U/L (ref 0–55)
AST (SGOT): 39 U/L (ref 10–42)
Albumin/Globulin Ratio: 1.12 Ratio (ref 0.80–2.00)
Albumin: 3.8 gm/dL (ref 3.5–5.0)
Alkaline Phosphatase: 77 U/L (ref 40–145)
Bilirubin Direct: 0.4 mg/dL — ABNORMAL HIGH (ref 0.0–0.3)
Bilirubin, Total: 0.8 mg/dL (ref 0.1–1.2)
Globulin: 3.4 gm/dL (ref 2.0–4.0)
Protein, Total: 7.2 gm/dL (ref 6.0–8.3)

## 2019-04-07 LAB — CBC AND DIFFERENTIAL
Basophils %: 0.9 % (ref 0.0–3.0)
Basophils Absolute: 0.1 10*3/uL (ref 0.0–0.3)
Eosinophils %: 0.2 % (ref 0.0–7.0)
Eosinophils Absolute: 0 10*3/uL (ref 0.0–0.8)
Hematocrit: 44.2 % (ref 36.0–48.0)
Hemoglobin: 13.7 gm/dL (ref 12.0–16.0)
Lymphocytes Absolute: 1.2 10*3/uL (ref 0.6–5.1)
Lymphocytes: 18.2 % (ref 15.0–46.0)
MCH: 31 pg (ref 28–35)
MCHC: 31 gm/dL — ABNORMAL LOW (ref 32–36)
MCV: 100 fL (ref 80–100)
MPV: 7.2 fL (ref 6.0–10.0)
Monocytes Absolute: 0.6 10*3/uL (ref 0.1–1.7)
Monocytes: 8.9 % (ref 3.0–15.0)
Neutrophils %: 71.8 % (ref 42.0–78.0)
Neutrophils Absolute: 4.7 10*3/uL (ref 1.7–8.6)
PLT CT: 269 10*3/uL (ref 130–440)
RBC: 4.4 10*6/uL (ref 3.80–5.00)
RDW: 11.4 % (ref 11.0–14.0)
WBC: 6.5 10*3/uL (ref 4.0–11.0)

## 2019-04-07 LAB — TROPONIN I: Troponin I: <0.01 ng/mL (ref 0.00–0.02)

## 2019-04-07 LAB — PROCALCITONIN: Procalcitonin: 0.04 ng/mL (ref 0.00–0.24)

## 2019-04-07 LAB — B-TYPE NATRIURETIC PEPTIDE: B-Natriuretic Peptide: 43.4 pg/mL (ref 0.0–100.0)

## 2019-04-07 LAB — C-REACTIVE PROTEIN: C-Reactive Protein: 7.16 mg/dL — ABNORMAL HIGH (ref 0.02–0.80)

## 2019-04-07 MED ORDER — ONDANSETRON 8 MG PO TBDP
4.00 mg | ORAL_TABLET | Freq: Three times a day (TID) | ORAL | 0 refills | Status: DC | PRN
Start: 2019-04-07 — End: 2020-07-06

## 2019-04-07 MED ORDER — ONDANSETRON 4 MG PO TBDP
ORAL_TABLET | ORAL | Status: AC
Start: 2019-04-07 — End: ?
  Filled 2019-04-07: qty 1

## 2019-04-07 MED ORDER — ONDANSETRON 4 MG PO TBDP
4.0000 mg | ORAL_TABLET | Freq: Once | ORAL | Status: AC
Start: 2019-04-07 — End: 2019-04-07
  Administered 2019-04-07: 09:00:00 4 mg via ORAL

## 2019-04-07 NOTE — ED Notes (Addendum)
Notified patient of positive covid result.  Discussed isolation precautions and reasons to return to the ED if worsening.  Notified ordering ED provider.  Notified [facility] Department.

## 2019-04-07 NOTE — ED Notes (Signed)
Pt eating jello po trial per PA

## 2019-04-07 NOTE — ED Provider Notes (Signed)
[facility]  EMERGENCY DEPARTMENT  History and Physical Exam     Patient Name: [name], [name]  Encounter Date:  04/07/2019  Treating Provider: Arlice Colt, PA-C  Supervising Physician: Dunn, [place] B, MD   Room:  N6/N6-A  Patient DOB:  [birth year]  Age: 78 y.o. female  MRN:  [id number]  PCP: Blake Divine, MD      Diagnosis/Disposition:  MDM:     Final Impression  1. Acute viral syndrome    2. Nausea        Disposition  ED Disposition     ED Disposition Condition Date/Time Comment    Discharge  Mon Apr 07, 2019 12:09 PM [name] discharge to home/self care.    Condition at disposition: Stable          Follow up  Blake Divine, MD  [address]  [place] [address]  [phone]          [facility] Emergency Department  [address]  [place] [address]  [phone]    If symptoms worsen      Prescriptions  Discharge Medication List as of 04/07/2019 12:09 PM      START taking these medications    Details   ondansetron (Zofran ODT) 8 MG disintegrating tablet Take 0.5-1 tablets (4-8 mg total) by mouth every 8 (eight) hours as needed for Nausea, Starting Mon 04/07/2019, E-Rx             Differential includes but is not limited to: viral syndrome, uti, PNA, COVID-19    Patient with nausea and malaise over the past few weeks.  Here with her husband who has similar symptoms and is also short of breath.  She denies any dyspnea.  Has had a normal pulse ox and ambulated in the room without difficulty.  She was given Zofran and had improvement of her nausea.  She was able to tolerate p.o. intake in the ED.  Patient would like to go home.  We discussed findings and return precautions.  Patient understands and agrees with plan.        ED Course as of Apr 06 1610   Mon Apr 07, 2019   1050 Feeling somewhat better and wants to try eating something.  Denies sob, single low spo2 noted, but this may be due to pt acrylic nails, when rechecked 96% on RA with good pleth.    [BM]      ED  Course User Index  [BM] Lynnae Prude, PA           The findings and plan have been discussed with the patient and/or the patient's family. All questions have been answered and return precautions given.    The results of diagnostic studies have been reviewed by myself. Available past medical, family, social, and surgical histories and past medical records have been reviewed by myself.       History of Presenting Illness:     Nurse Triage: decreased appetite, vomiting off and on for few weeks, husband also ill with SOB    Chief complaint: Nausea      HPI/ROS is limited by: none  HPI/ROS given by: patient and EMS    [name] is a 78 y.o. female presenting via EMS for nausea and decreased appetite for the past 2 to 3 weeks.  States she had a low-grade fever at 1 point but this seemed to have resolved.  She came in for evaluation with her  husband who is also been ill for about the same amount of time possibly a week longer.  Husband is short of breath and coughing.  Patient is coughing in the room but denies any shortness of breath or chest pain.  Takes medication for hypertension hyperlipidemia but denies any other PMH or current medications.        Review of Systems:  Physical Exam:       Review of Systems   Constitutional: Positive for malaise/fatigue. Negative for chills and fever.   HENT: Negative.    Eyes: Negative.  Negative for blurred vision.   Respiratory: Negative.  Negative for cough and shortness of breath.    Cardiovascular: Negative.  Negative for chest pain.   Gastrointestinal: Positive for nausea. Negative for abdominal pain.   Genitourinary: Negative.    Musculoskeletal: Negative.  Negative for joint pain and myalgias.   Skin: Negative.  Negative for rash.   Neurological: Negative.  Negative for dizziness, sensory change, focal weakness and headaches.   Endo/Heme/Allergies: Negative.    Psychiatric/Behavioral: Negative.    All other systems reviewed and are negative.      Complete review  of systems is otherwise negative except as indicated in the HPI.  Blood pressure 152/75, pulse 79, temperature 99.1 F (37.3 C), resp. rate 20, height 1.6 m, weight 63.5 kg, SpO2 95 %.     Constitutional: WD/WN, active, NAD   Eyes: PERRL.  Conjunctiva pink, sclera anicteric, no discharge.  Visual acuity grossly intact.    HEENT:  NCAT. Ears: No external lesions.   Nose: No external lesions or discharge.    Oropharynx clear, moist mucous membranes.   Neck: Trachea midline. No JVD. Normal ROM. No apparent masses.  Respiratory: Effort normal without accessory muscle use. Lungs clear with vesicular bases.  Cardiovascular: RRR, no MRG.  No appreciable peripheral edema. Brisk cap refill.  Abdomen: Soft, non-tender, non-distended. No palpable masses or hernias.  Musculoskeletal: Normal range of motion. No deformity or apparent injury. Strength grossly intact bilateral legs  Neurological: Pt is alert. No focal motor deficits. Speech normal. CN II-XII grossly normal.   Psychiatric: Affect is appropriate. There is no agitation.   Skin: Warm and dry. No rash.       Diagnostic Results:     RADIOLOGIC STUDIES    All images have been personally viewed by me    Xr Chest Ap Portable    Result Date: 04/07/2019  No acute findings. ReadingStation:[email or web address]      LAB STUDIES    All lab value have been personally reviewed by me    Results     Procedure Component Value Units Date/Time    Urinalysis with Microscopic [[id number]]  (Abnormal) Collected: 04/07/19 1108    Specimen: Urine, Random Updated: 04/07/19 1201     Color, UA Amber - Color may affect some analytes.     Clarity, UA Slightly Cloudy     Urine Specific Gravity 1.025     pH, Urine 5.0 pH      Protein, UR 100 mg/dL      Glucose, UA Negative mg/dL      Ketones UA 5 mg/dL      Bilirubin, UA Negative     Blood, UA Small     Nitrite, UA Negative     Urobilinogen, UA 2.0 mg/dL      Leukocyte Esterase, UA Negative Leu/uL      UR Micro Performed     WBC, UA 4 /hpf  RBC, UA 2 /hpf       Bacteria, UA Rare /hpf      Squam Epithel, UA 1 /hpf     Procalcitonin [[id number]] Collected: 04/07/19 0900    Specimen: Plasma Updated: 04/07/19 1014     Procalcitonin 0.04 ng/mL     Troponin I [[id number]] Collected: 04/07/19 0900    Specimen: Plasma Updated: 04/07/19 0953     Troponin I <0.01 ng/mL     Basic Metabolic Panel [[id number]]  (Abnormal) Collected: 04/07/19 0900    Specimen: Plasma Updated: 04/07/19 0948     Sodium 137 mMol/L      Potassium 3.2 mMol/L      Chloride 100 mMol/L      CO2 19.1 mMol/L      Calcium 8.9 mg/dL      Glucose 086 mg/dL      Creatinine 5.78 mg/dL      BUN 22 mg/dL      Anion Gap 46.9 mMol/L      BUN / Creatinine Ratio 29.7 Ratio      EGFR 78 mL/min/1.87m2      Osmolality Calculated 278 mOsm/kg     Hepatic function panel (LFT) [[id number]]  (Abnormal) Collected: 04/07/19 0900    Specimen: Plasma Updated: 04/07/19 0948     Protein, Total 7.2 gm/dL      Albumin 3.8 gm/dL      Alkaline Phosphatase 77 U/L      ALT 21 U/L      AST (SGOT) 39 U/L      Bilirubin, Total 0.8 mg/dL      Bilirubin Direct 0.4 mg/dL      Albumin/Globulin Ratio 1.12 Ratio      Globulin 3.4 gm/dL     C Reactive Protein [[id number]]  (Abnormal) Collected: 04/07/19 0900    Specimen: Plasma Updated: 04/07/19 0948     C-Reactive Protein 7.16 mg/dL     B-type Natriuretic Peptide [[id number]] Collected: 04/07/19 0900    Specimen: Blood Updated: 04/07/19 0944     B-Natriuretic Peptide 43.4 pg/mL     CBC and differential [[id number]]  (Abnormal) Collected: 04/07/19 0900    Specimen: Blood Updated: 04/07/19 0920     WBC 6.5 K/cmm      RBC 4.40 M/cmm      Hemoglobin 13.7 gm/dL      Hematocrit 75.6 %      MCV 100 fL      MCH 31 pg      MCHC 31 gm/dL      RDW 43.3 %      PLT CT 269 K/cmm      MPV 7.2 fL      Neutrophils % 71.8 %      Lymphocytes 18.2 %      Monocytes 8.9 %      Eosinophils % 0.2 %      Basophils % 0.9 %      Neutrophils Absolute 4.7 K/cmm      Lymphocytes Absolute 1.2 K/cmm      Monocytes Absolute 0.6 K/cmm       Eosinophils Absolute 0.0 K/cmm      Basophils Absolute 0.1 K/cmm               EKG:     EKG:   Last EKG Result     None             PROCEDURES     1.      ORDERS PLACED THIS VISIT  Orders  Orders Placed This Encounter   Procedures    Respiratory Specimen Aptima SARS-CoV-2 Assay (Panther System)(R)    XR Chest AP Portable    Basic Metabolic Panel    Hepatic function panel (LFT)    CBC and differential    C Reactive Protein    Procalcitonin    B-type Natriuretic Peptide    Troponin I    Urinalysis with Microscopic    Oxygen-Adult Low Flow Nasal Cannula (6lpm or less)    Encourage the patient to lay fully prone    ECG 12 lead       Medications  Medications   ondansetron (ZOFRAN-ODT) disintegrating tablet 4 mg (4 mg Oral Given 04/07/19 0855)   ondansetron (ZOFRAN-ODT) 4 MG disintegrating tablet (has no administration in time range)              Allergies & Medications:     Pt has No Known Allergies.    Discharge Medication List as of 04/07/2019 12:09 PM      CONTINUE these medications which have NOT CHANGED    Details   latanoprost (XALATAN) 0.005 % ophthalmic solution 1 drop nightly., Historical Med      Multiple Vitamin (MULTIVITAMIN) tablet Take 1 tablet by mouth daily., Historical Med      rosuvastatin (CRESTOR) 10 MG tablet Take 10 mg by mouth daily., Historical Med      valsartan-hydrochlorothiazide (DIOVAN-HCT) 80-12.5 MG per tablet Take 2 tablets by mouth daily. 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    Other and unspecified hyperlipidemia 10/24/2012    Personal history of colonic polyps 10/24/2012    Routine  gynecological examination 10/24/2012       Surgical:   Past Surgical History:   Procedure Laterality Date    CATARACT EXTRACTION, BILATERAL      WITH LENS INSERTION    DILATION AND CURETTAGE OF UTERUS      MISCARRIAGE    EGD N/A 11/10/2016    Procedure: EGD;  Surgeon: Chauncey Fischer, MD;  Location: [facility];  Service: Gastroenterology;  Laterality: N/A;    PYLOROPLASTY      PYLORIC STENOSIS, 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 She has never used smokeless tobacco. She reports current alcohol use. She reports that she does not use drugs.        ATTESTATIONS     Arlice Colt, PA-C    The results of diagnostic studies have been reviewed by myself. The above past medical, family, social, and  surgical histories have been reviewed by myself. The clinical impression and plan have been discussed with the patient and/or the patient's family. All questions have been answered.    Note:  This chart was generated by an EMR and may contain errors, including typographical, or omissions not intended by the user. This chart was generated by the Epic EMR system/speech recognition and may contain inherent errors or omissions not intended by the user. Grammatical errors, random word insertions, deletions, pronoun errors and incomplete sentences are occasional consequences of this technology due to software limitations. Not all errors are caught or corrected. If there are questions or concerns about the content of this note or information contained within the body of this dictation they should be addressed directly with the author for clarification          Lynnae Prude, [place]  04/07/19 1612       Dunn, [place] B,  MD  04/08/19 [phone]

## 2019-04-07 NOTE — Discharge Instructions (Signed)
Viral Syndrome (Adult)  A viral illness may cause a number of symptoms such as fever. Other symptoms depend on the part of the body that the virus affects. If it settles in your nose, throat, and lungs, it may cause cough, sore throat, congestion, runny nose, headache, earache and other ear symptoms, or shortness of breath. If it settles in your stomach and intestinal tract, it may cause nausea, vomiting, cramping, and diarrhea. Sometimes it causes generalized symptoms like "aching all over," feeling tired, loss of energy, or loss of appetite.  A viral illness usually lasts anywhere from several days to several weeks, but sometimes it lasts longer. In some cases, a more serious infection can look like a viral syndrome in the first few days of the illness. You may need another exam and additional tests to know the difference. Watch for the warning signs listed below for when to seek medical advice.  Home care  Follow these guidelines for taking care of yourself at home:   If symptoms are severe, rest at home for the first 2 to 3 days.   Stay away from cigarette smoke - both your smoke and the smoke from others.   You may use over-the-counteracetaminophen or ibuprofen for fever, muscle aching, and headache, unless another medicine was prescribed for this. If you have chronic liver or kidney disease or ever had a stomach ulcer or gastrointestinal bleeding, talk with your healthcare provider before using these medicines. No one who is younger than 18 and ill with a fever should take aspirin. It may cause severe disease or death.   Your appetite may be poor, so a light diet is fine. Avoid dehydration by drinking 8 to 12, 8-ounce glasses of fluids each day. This may include water; orange juice; lemonade; apple, grape, and cranberry juice; clear fruit drinks; electrolyte replacement and sports drinks; and decaffeinated teas and coffee. If you have been diagnosed with a kidney disease, ask your healthcare provider  how much and what types of fluids you should drink to prevent dehydration. If you have kidney disease, drinking too much fluid can cause it build up in the your body and be dangerous to your health.   Over-the-counter remedies won't shorten the length of the illness but may be helpful for symptoms such as cough, sore throat, nasal and sinus congestion, or diarrhea. Don't use decongestants if you have high blood pressure.  Follow-up care  Follow up with your healthcare provider if you do not improve over the next week.  Call 911  Call 911 if any of the following occur:   Convulsion   Feeling weak, dizzy, or like you are going to faint   Chest pain, or more than mild shortness of breath  When to seek medical advice  Call your healthcare provider right away if any of these occur:   Cough with lots of colored sputum (mucus) or blood in your sputum   Chest pain, shortness of breath, wheezing, or trouble breathing   Severe headache; face, neck, or ear pain   Severe, constant pain in the lower right side of your belly (abdominal)   Continued vomiting (can't keep liquids down)   Frequent diarrhea (more than 5 times a day); blood (red or black color) or mucus in diarrhea   Feeling weak, dizzy, or like you are going to faint   Extreme thirst   Fever of 100.4F (38C) or higher, or as directed by your healthcare provider  [employer] last reviewed this educational content on 08/27/2016     2000-2020 [employer] [address], [place] [address]. All rights reserved. This information is not intended as a substitute for professional medical care. Always follow your healthcare professional's instructions.      Thank you for choosing [facility] for your emergency care needs. We strive to provide EXCELLENT care to you and your family.      YOUR ACCURATE CONTACT INFORMATION IS VERY IMPORTANT    Before leaving please check with registration to make sure we have an up-to-date contact  number. A Toll-free post discharge customer service number is available to update your registration/insurance information as well as answer any billing questions or concerns. That number is [phone].       IF YOU DO NOT CONTINUE TO IMPROVE OR YOUR CONDITION WORSENS, PLEASE CONTACT YOUR DOCTOR OR RETURN IMMEDIATELY TO THE EMERGENCEY DEPARTMENT.      EXTRA AVAILABLE RESOURCES:    1. DOCTOR REFERRALS  a. Call  our Physician Referral Line @ [phone] If you need  further assistance with referrals to primary care or specialty services our Case Manager may assist at [phone].  2. FREE HEALTH SERVICES  a. www.freemedicalsearch.org  b. http://[email or web address]  May be utilized if you need help with health or social services, please call 2-1-1 for a free referral to resources in your area. 2-1-1 is a free service connecting people with information on health insurance, free clinics, pregnancy, mental health, dental care, food assistance, housing, and substance abuse counseling.  3. MEDICAL RECORDS AND TESTS  Certain laboratory test results do not come back the same day, for example: urine cultures may take 3 days. We will attempt to contact you if other important findings are noted. Some lab testing may take 2-5 days. Radiology films are reviewed again to ensure accuracy. If there is any discrepancy, we will notify you. If you have questions or concerns, our Case Manager is available Monday thru Friday, 7:30a-3:00p, for follow up or test result questions. Please call [phone].  4. ED PATIENT ADVOCATE SERVICES: [phone]. Contact for general questions and concerns, daily 11:00a-11:00p.      DISCHARGE MESSAGE:     YOU ARE THE MOST IMPORTANT FACTOR IN YOUR RECOVERY. Follow the above instructions carefully. Take your medicines as prescribed. Most important, see your doctor in follow-up as recommended by your ED physician.  Call your doctor if your condition worsens or if you have any new, worsening, or  severe symptoms. If you need further care and cannot be seen by your recommended follow-up doctor, the Emergency Department Case Manager will be available to help as needed [phone].  If you require immediate assistance, return to the Emergency Department or call 911.    Pharmacy information  For your convenience please visit [facility] for your prescription needs. [facility] has extended evening and weekend hours, including holidays, to ensure patients can begin taking medications as soon as possible.  Most insurance plans are accepted.    Pharmacy Hours:  Monday - Friday: 8:30a to 8:30p  Saturday: 9a to 1p  Sunday: 9a to 1p and 6p to 9p  Holidays: 9a to 1p    Pharmacy Phone: [phone]      [facility] has been providing home care solutions for independent living since 1984. Servicing [place]. [facility] is a full service home medical provider of home oxygen and respiratory care, medical equipment and supplies. [phone].    Thanks Again, for   allowing [facility]   Emergency Department to serve you.  Internal Medicine and Family Practices Accepting New Patients  [facility] [phone]    [facility] [phone]    [facility] [phone] Medicaid accepted   [facility] [phone]    [facility] [phone] Medicaid accepted   [facility] [phone]    [facility] [phone] Walk in clinic available   [facility] [phone] Medicaid accepted   [facility] [phone]    [facility] [phone] Medicaid accepted  Sliding scale available   [facility]  [phone]    [facility] [phone]    [facility] [phone] ([phone])    [facility] [phone] Spanish spoken   [facility]   [phone] [place] Medicaid accepted  Sliding scale available   [facility] [phone] Medicaid accepted  Spanish spoken   [facility] [phone]

## 2019-04-07 NOTE — ED Triage Notes (Signed)
Pt reports she has been feeling severely nauseated for past 24 hours.  Pt reports that she has had several episodes of diarrhea over fast few weeks.  Pt reports that her husband also is ill with SOB.  Pt reports that this am her nausea was so severe that her husband made her come for an evaluation, pt denies pain

## 2019-04-07 NOTE — ED Notes (Signed)
Bed: N6-A  Expected date:   Expected time:   Means of arrival:   Comments:  EMS

## 2019-04-09 LAB — ECG 12-LEAD
P Wave Axis: 60 deg
P-R Interval: 161 ms
Patient Age: 78 years
Q-T Interval(Corrected): 458 ms
Q-T Interval: 397 ms
QRS Axis: 30 deg
QRS Duration: 107 ms
T Axis: 59 years
Ventricular Rate: 80 //min

## 2019-04-14 ENCOUNTER — Emergency Department
Admission: EM | Admit: 2019-04-14 | Discharge: 2019-04-14 | Disposition: A | Discharge status: Home / Self Care | Payer: Medicare PPO | Attending: Emergency Medicine | Admitting: Emergency Medicine

## 2019-04-14 DIAGNOSIS — R110 Nausea: Secondary | ICD-10-CM | POA: Insufficient documentation

## 2019-04-14 DIAGNOSIS — U071 COVID-19: Secondary | ICD-10-CM | POA: Insufficient documentation

## 2019-04-14 DIAGNOSIS — R0602 Shortness of breath: Secondary | ICD-10-CM | POA: Insufficient documentation

## 2019-04-14 LAB — CBC AND DIFFERENTIAL
Basophils %: 1 % (ref 0.0–3.0)
Basophils Absolute: 0.1 10*3/uL (ref 0.0–0.3)
Eosinophils %: 1.2 % (ref 0.0–7.0)
Eosinophils Absolute: 0.1 10*3/uL (ref 0.0–0.8)
Hematocrit: 39.7 % (ref 36.0–48.0)
Hemoglobin: 13.1 gm/dL (ref 12.0–16.0)
Lymphocytes Absolute: 2.2 10*3/uL (ref 0.6–5.1)
Lymphocytes: 31.3 % (ref 15.0–46.0)
MCH: 33 pg (ref 28–35)
MCHC: 33 gm/dL (ref 32–36)
MCV: 100 fL (ref 80–100)
MPV: 6.8 fL (ref 6.0–10.0)
Monocytes Absolute: 0.6 10*3/uL (ref 0.1–1.7)
Monocytes: 8.5 % (ref 3.0–15.0)
Neutrophils %: 57.9 % (ref 42.0–78.0)
Neutrophils Absolute: 4 10*3/uL (ref 1.7–8.6)
PLT CT: 504 10*3/uL — ABNORMAL HIGH (ref 130–440)
RBC: 3.98 10*6/uL (ref 3.80–5.00)
RDW: 11.4 % (ref 11.0–14.0)
WBC: 6.9 10*3/uL (ref 4.0–11.0)

## 2019-04-14 LAB — HEPATIC FUNCTION PANEL
ALT: 31 U/L (ref 0–55)
AST (SGOT): 41 U/L (ref 10–42)
Albumin/Globulin Ratio: 1.13 Ratio (ref 0.80–2.00)
Albumin: 3.6 gm/dL (ref 3.5–5.0)
Alkaline Phosphatase: 75 U/L (ref 40–145)
Bilirubin Direct: 0.4 mg/dL — ABNORMAL HIGH (ref 0.0–0.3)
Bilirubin, Total: 0.7 mg/dL (ref 0.1–1.2)
Globulin: 3.2 gm/dL (ref 2.0–4.0)
Protein, Total: 6.8 gm/dL (ref 6.0–8.3)

## 2019-04-14 LAB — TROPONIN I: Troponin I: <0.01 ng/mL (ref 0.00–0.02)

## 2019-04-14 LAB — BASIC METABOLIC PANEL
Anion Gap: 14.5 mMol/L (ref 7.0–18.0)
BUN / Creatinine Ratio: 28.4 Ratio (ref 10.0–30.0)
BUN: 21 mg/dL (ref 7–22)
CO2: 26 mMol/L (ref 20–30)
Calcium: 9.5 mg/dL (ref 8.5–10.5)
Chloride: 104 mMol/L (ref 98–110)
Creatinine: 0.74 mg/dL (ref 0.60–1.20)
EGFR: 78 mL/min/{1.73_m2} (ref 60–150)
Glucose: 130 mg/dL — ABNORMAL HIGH (ref 71–99)
Osmolality Calculated: 286 mOsm/kg (ref 275–300)
Potassium: 3.5 mMol/L (ref 3.5–5.3)
Sodium: 141 mMol/L (ref 136–147)

## 2019-04-14 LAB — D-DIMER, QUANTITATIVE: D-Dimer: 0.82 mg/L FEU — ABNORMAL HIGH (ref 0.19–0.52)

## 2019-04-14 MED ORDER — ONDANSETRON HCL 4 MG/2ML IJ SOLN
INTRAMUSCULAR | Status: AC
Start: 2019-04-14 — End: ?
  Filled 2019-04-14: qty 2

## 2019-04-14 MED ORDER — METOCLOPRAMIDE HCL 5 MG/ML IJ SOLN
10.00 mg | Freq: Once | INTRAMUSCULAR | Status: AC
Start: 2019-04-14 — End: 2019-04-14
  Administered 2019-04-14: 13:00:00 10 mg via INTRAVENOUS

## 2019-04-14 MED ORDER — SODIUM CHLORIDE 0.9 % IV BOLUS
1000.00 mL | Freq: Once | INTRAVENOUS | Status: AC
Start: 2019-04-14 — End: 2019-04-14
  Administered 2019-04-14: 14:00:00 1000 mL via INTRAVENOUS

## 2019-04-14 MED ORDER — PROCHLORPERAZINE EDISYLATE 10 MG/2ML IJ SOLN
INTRAMUSCULAR | Status: AC
Start: 2019-04-14 — End: ?
  Filled 2019-04-14: qty 2

## 2019-04-14 MED ORDER — METOCLOPRAMIDE HCL 5 MG/ML IJ SOLN
INTRAMUSCULAR | Status: AC
Start: 2019-04-14 — End: ?
  Filled 2019-04-14: qty 2

## 2019-04-14 MED ORDER — PROCHLORPERAZINE MALEATE 10 MG PO TABS
10.0000 mg | ORAL_TABLET | Freq: Four times a day (QID) | ORAL | 0 refills | Status: DC | PRN
Start: 2019-04-14 — End: 2020-07-06

## 2019-04-14 MED ORDER — SODIUM CHLORIDE 0.9 % IV BOLUS
1000.00 mL | Freq: Once | INTRAVENOUS | Status: AC
Start: 2019-04-14 — End: 2019-04-14
  Administered 2019-04-14: 11:00:00 1000 mL via INTRAVENOUS

## 2019-04-14 MED ORDER — METOCLOPRAMIDE HCL 10 MG PO TABS
10.0000 mg | ORAL_TABLET | Freq: Four times a day (QID) | ORAL | 0 refills | Status: DC | PRN
Start: 2019-04-14 — End: 2020-07-06

## 2019-04-14 MED ORDER — ONDANSETRON HCL 4 MG/2ML IJ SOLN
4.00 mg | Freq: Once | INTRAMUSCULAR | Status: AC | PRN
Start: 2019-04-14 — End: 2019-04-14
  Administered 2019-04-14: 11:00:00 4 mg via INTRAVENOUS

## 2019-04-14 MED ORDER — PROCHLORPERAZINE EDISYLATE 10 MG/2ML IJ SOLN
10.00 mg | Freq: Once | INTRAMUSCULAR | Status: AC | PRN
Start: 2019-04-14 — End: 2019-04-14
  Administered 2019-04-14: 12:00:00 10 mg via INTRAVENOUS

## 2019-04-14 NOTE — EDIE (Signed)
COLLECTIVE?NOTIFICATION?04/14/2019 10:45?SRA, CASTLES A?MRN: [id number]    Criteria Met      COVID-19 Positive Lab Results    Security and Safety  No recent Security Events currently on file    ED Care Guidelines  There are currently no ED Care Guidelines for this patient. Please check your facility's medical records system.    Flags      Positive COVID-19 Lab Result - [facility] - A specimen collected from this patient was positive for COVID-19 / Attributed By: [place] [facility] / Attributed On: 04/12/2019       Prescription Monitoring Program  000??- Narcotic Use Score  000??- Sedative Use Score  000??- Stimulant Use Score  000??- Overdose Risk Score  - All Scores range from 000-999 with 75% of the population scoring < 200 and on 1% scoring above 650  - The last digit of the narcotic, sedative, and stimulant score indicates the number of active prescriptions of that type  - Higher Use scores correlate with increased prescribers, pharmacies, mg equiv, and overlapping prescriptions  - Higher Overdose Risk Scores correlate with increased risk of unintentional overdose death   Concerning or unexpectedly high scores should prompt a review of the PMP record; this does not constitute checking PMP for prescribing purposes.      E.D. Visit Count (12 mo.)  Facility Visits   [facility] 2   Total 2   Note: Visits indicate total known visits.      Recent Emergency Department Visit Summary  Date Facility [facility] Type Diagnoses or Chief Complaint   Apr 14, 2019 [facility]     Apr 07, 2019 [facility] [place]      [facility]      Nausea      Viral infection, unspecified          Recent Inpatient Visit Summary  No recorded inpatient visits.     Care Team  Provider Specialty Phone Fax Service Dates   [facility] Morn [facility]   Current      Collective Portal  This patient has registered at the [facility] Emergency  Department   For more information visit: [email or web address]     PLEASE NOTE:     1.   Any care recommendations and other clinical information are provided as guidelines or for historical purposes only, and providers should exercise their own clinical judgment when providing care.    2.   You may only use this information for purposes of treatment, payment or health care operations activities, and subject to the limitations of applicable Collective Policies.    3.   You should consult directly with the organization that provided a care guideline or other clinical history with any questions about additional information or accuracy or completeness of information provided.    ? 2020 [place]. - [email or web address]

## 2019-04-14 NOTE — Discharge Instructions (Signed)
Coronavirus Disease 2019 (COVID-19): Caring for Yourself or Others   If you or a household member have symptoms of COVID-19, follow the guidelines below for preventing spread of the virus, and managing symptoms.   If you think you have COVID-19 symptoms  · Stay home. Call your healthcare provider and tell them you have symptoms of COVID-19. Do this before going to any hospital or clinic. Follow your provider's instructions. You may be advised to isolate yourself at home. This is called self-isolation.  · Don’t panic. Keep in mind that other illnesses can cause similar symptoms.  · Stay away from work, school, and public places. Limit physical contact with family members. Limit visitors. Don't kiss anyone or share eating or drinking utensils. Clean surfaces you touch with disinfectant. This is to help prevent the virus from spreading.  · If you need to cough or sneeze, do it into a tissue. Then throw the tissue into the trash. If you don't have tissues, cough or sneeze into the bend of your elbow.  · Don’t share food or personal items with people in your household. This includes items like eating and drinking utensils, towels, and bedding.  · Wear a cloth face mask around other people. During a public health emergency, medical face masks may be reserved for healthcare workers. You may need to make a cloth face mask of your own. You can do this using a bandana, T-shirt, or other cloth. The CDC has instructions on how to make a face mask. Wear the mask so that it covers both your nose and mouth.  · If you need to go to a hospital or clinic, expect that the healthcare staff will wear protective equipment such as masks, gowns, gloves, and eye protection. You may be put in a separate room. This is to prevent the possible virus from spreading.  · Tell the healthcare staff about recent travel. This includes local travel on public transport. Staff may need to find other people you have been in contact with.  · Follow all  instructions the healthcare staff give you.    If you have been diagnosed with COVID-19  · Stay home and start self-isolation. Don’t leave your home unless you need to get medical care. Don't go to work, school, or public areas. Don't use public transportation or taxis.  · Follow all instructions from your healthcare provider. Call your healthcare provider’s office before going. They can prepare and give you instructions. This will help prevent the virus from spreading.  · If you need to go to a hospital or clinic, expect that the healthcare staff will wear protective equipment such as masks, gowns, gloves, and eye protection. You may be put in a separate room. This is to prevent the possible virus from spreading.  · Wear a face mask. This is to protect other people from your germs. If you are not able to wear a mask, your caregivers should. During a public health emergency, medical face masks may be reserved for healthcare workers. You may need to make a cloth face mask of your own. You can do this using a bandana, T-shirt, or other cloth. The CDC has instructions on how to make a face mask. Wear the mask so that it covers both your nose and mouth.  · Stay away from other people in your home.  · Have no contact with pets and animals.  · Don’t share food or personal items with people in your household. This includes items like eating and   drinking utensils, towels, and bedding.  · If you need to cough or sneeze, do it into a tissue. Then throw the tissue into the trash. If you don't have tissues, cough or sneeze into the bend of your elbow.  · Wash your hands often.    Self-care at home   There is currently no medicine approved to prevent or treat the virus. Some experimental and other medicines are being tested against COVID-19. Other medicines used to treat other conditions are being looked at for COVID-19, but they are not currently approved to treat it.   Current treatment is mainly aimed at helping your body  while it fights the virus. This is known as supportive care. Take care of yourself at home by:   · Getting rest. This helps your body fight the illness.  · Staying hydrated.  Drinking liquids is the best way to prevent dehydration. Try to drink 6 to 8 glasses of liquids every day, or as advised by your provider. Also check with your provider about which fluids are best for you. Don't drink fluids that contain caffeine or alcohol.  · Taking over-the-counter (OTC) pain medicine. These are used to help ease pain and reduce fever. Follow your healthcare provider's instructions for which OTC medicine to use.  If you've been in the hospital for suspected or confirmed COVID-19 and now are home, follow all of your healthcare team's instructions. This will include when it's OK to stop self-isolation. You may also get instructions on position changes to help your breathing, such as lying on your belly (prone positioning).   If you've had confirmed COVID-19, your healthcare team may ask you to consider donating your plasma. This is called COVID-19 convalescent plasma donation. Plasma from people fully recovered from COVID-19 may contain antibodies to help fight COVID-19 in people who are currently seriously ill with the disease. Experts don't know the safety of COVID-19 convalescent plasma or how well it works. Research continues. The FDA has approved it for emergency use in certain people with serious or life-threatening COVID-19.   Caring for a sick person   · Follow all instructions from healthcare staff.  · Wash your hands often.  · Wear protective clothing as advised.  · Make sure the sick person wears a mask. If they can't wear a mask, don't stay in the same room with the person. If you must be in the same room, wear a face mask. When wearing a mask, make sure that it covers both the nose and mouth.  · Keep track of the sick person’s symptoms.  · Clean home surfaces often with disinfectant. This includes phones, kitchen  counters, fridge door handle, bathroom surfaces, and others.  · Don’t let anyone share household items with the sick person. This includes eating and drinking tools, towels, sheets, or blankets.  · Clean fabrics and laundry thoroughly.  · Keep other people and pets away from the sick person.    When you can stop self-isolation  When you are sick with COVID-19, you should stay away from other people. This is called self-isolation.   Your limits are different if you've had COVID-19 in the last 3 months but are fully recovered without symptoms and you have been exposed to someone with COVID-19. If you are symptom-free, you don't need to stay home away from others or be retested. The CDC doesn't recommend retesting unless you have symptoms of COVID-19 and your new symptoms can't be linked to another illness. Contact your healthcare provider if you   have any questions. If you develop symptoms, stay home. If you had COVID-19 over 3 months ago and have been exposed again, treat it like you've never had COVID-19 and stay home, limit your contact with others, call your provider, and monitor for symptoms.   If you are normally healthy, the CDC does not advise retesting for COVID-19 with nose-throat swabs. You can stop self-isolation when all 3 of these are true:   1. You have had no fever for at least 24 hours. This means no fever without medicine that reduces fever, such as acetaminophen, for at least 24 hours.  2. Your symptoms such as cough or trouble breathing have improved.  3. It has been at least 10 days since your first symptoms started.  Talk with your healthcare provider before you leave home. Tell them if the 3 things above are true for you. They may tell you it’s OK to leave home. In some cases, your state or local area may have specific advice. Your healthcare provider will tell you more.    If you have a weak immune system and COVID-19, or if you've had severe COVID-19,  your instructions on when to stop  isolation will be somewhat different. Some conditions and treatments can cause a weak immune system. These include cancer treatment, bone marrow or organ transplants, and conditions such as HIV or other immune system disorders. You may be advised to stay home from 10 days to 20 days after your symptoms first started. Your healthcare provider may want to retest you for COVID-19. Follow your provider's instructions.   When you return to public settings  When you are well enough to go outside your home, consider the CDC's guidance on cloth face masks:     · The CDC advises all people over age 2 to wear cloth face masks in public settings when around people outside of their household, especially when it's hard to socially distance. For example, wear a face mask in populated places such as public transit, public protests and marches, and crowded stores, bars, and restaurants.  · Cloth masks may help prevent people who have COVID-19 form spreading the virus to others.  · Cloth masks are most likely to reduce COVID-19 spread when masks are widely used by people who are out in the public.    Certain people should not wear a face covering. This includes:   · Children younger than 2 years old  · Anyone with a health, developmental, or mental health condition that can be made worse by wearing a mask  · Anyone who is unconscious or unable to remove the face covering without help. See the CDC's guidance on who should not wear a face mask.    When to call your healthcare provider  Call your healthcare provider right away if a sick person has any of these:   · Trouble breathing  · Pain or pressure in chest  If a sick person has any of these, call 911:  · Trouble breathing that gets worse  · Pain or pressure in chest that gets worse  · Blue tint to lips or face  · Fast or irregular heartbeat  · Confusion or trouble waking  · Fainting or loss of consciousness  · Coughing up blood  Going home from the hospital   If you were diagnosed  with COVID-19 and were recently discharged from the hospital:   · Follow the instructions above for self-care and isolation.  · Follow the hospital healthcare team’s   specific instructions.   Ask questions if anything is unclear to you. Write down answers so you remember them.  Date last modified: 01/22/2019  StayWell last reviewed this educational content on 08/28/2018   2000-2020 [employer], [place]. [address], [place], [place] [address]. All rights reserved. This information is not intended as a substitute for professional medical care. Always follow your healthcare professional's instructions.        Diet for Vomiting or Diarrhea (Adult)    Your symptoms may return or get worse after eating certain foods listed below. If this happens, stop eating these foods until your symptoms ease and you feel better.  Once the vomiting stops, follow the steps below.  During the first 12 to 24 hours  During the first 12 to 24 hours, follow this diet:   Drinks.Plain water, sport drinks like electrolyte solutions, drinks without caffeine, mineral water (plain or flavored), and clear fruit juices. Don't have drinks with caffeine or citrus juices. This is because they are high in acid and can irritate your stomach.   Soups.Clear broth.   Desserts. Plain gelatin, frozen ice pops, and fruit juice bars without pieces of fruit. As you feel better, you may add 6to 8 ounces of yogurt per day. If you have diarrhea, don't havefoods or drinks with sugar, high-fructose corn syrup, or sugar alcohols.  During the next 24 hours  During the next 24 hours you may add the following to the above:   Hot cereal, plain toast, bread, rolls, and crackers   Plain noodles, rice, mashed potatoes, and chicken noodle or rice soup   Unsweetened canned fruit (but not pineapple) and bananas  Don't eat more than15 grams of fat a day. Do this by staying away from margarine, butter, oils, mayonnaise, sauces, gravies, fried foods, peanut  butter, meat, poultry, and fish.  Don't eat much fiber. Stay away fromraw or cooked vegetables, fresh fruits (except bananas), and bran cereals.  Limit how muchcaffeine and chocolate you have. Do' use anyspices or seasonings except salt.  During the next 24 hours  Slowly go back to your normal diet, as you feel better and your symptoms ease.  StayWell last reviewed this educational content on 01/27/2018   2000-2020 [employer], [place]. [address] [place], [place] [address]. All rights reserved. This information is not intended as a substitute for professional medical care. Always follow your healthcare professional's instructions.      Thank you for choosing [facility] for your emergency care needs. We strive to provide EXCELLENT care to you and your family.      YOUR ACCURATE CONTACT INFORMATION IS VERY IMPORTANT    Before leaving please check with registration to make sure we have an up-to-date contact number. If you have billing or insurance questions, call [phone].    IF YOU DO NOT CONTINUE TO IMPROVE OR YOUR CONDITION WORSENS, PLEASE CONTACT YOUR DOCTOR OR RETURN IMMEDIATELY TO THE EMERGENCEY DEPARTMENT.    EXTRA AVAILABLE RESOURCES:    1. DOCTOR REFERRALS  If you need  further assistance with referrals to primary care or specialty services our Nurse Navigator [facility] Carlena Hurl) may assist you at [phone].  2. FREE HEALTH SERVICES  [email or web address]  May be utilized if you need help with health or social services, please call 2-1-1 for a free referral to resources in your area. 2-1-1 is a free service connecting people with information on health insurance, free clinics, pregnancy, mental health, dental care, food assistance,  housing, and substance abuse counseling.  3. MEDICAL RECORDS AND TESTS  Certain laboratory test results do not come back the same day, for example: urine cultures may take 3 days. We will attempt to contact you if other important findings are  noted. Some lab testing may take 2-5 days. Radiology films are reviewed again to ensure accuracy. If there is any discrepancy, we will notify you.  4. MY CHART  free, easy, secure way to view portions of your health information.  For assistance or to create an account, call [phone], option 4  5. RESPIRATORY CARE CLINIC  call [phone] if you have respiratory illness or symptoms, or questions about testing for the novel coronavirus  6. DISASTER DISTRESS HELP LINE  If you, or someone you care about, are feeling overwhelmed with emotions like sadness, depression, or anxiety, call [phone] or text [employer]Us to [phone]       DISCHARGE MESSAGE:    YOU ARE THE MOST IMPORTANT FACTOR IN YOUR RECOVERY. Follow the above instructions carefully.  Take your medicines as prescribed. Most important, see your  doctor in follow-up as recommended by your ED physician.  Call your doctor if your condition worsens or if you have any new, worsening, or severe symptoms. If you need further care and cannot be seen by your recommended follow-up doctor, the Emergency Department Nurse Navigator [facility] Carlena Hurl) will be available to help as needed [phone].  If you require immediate assistance, return to the Emergency Department or call 911.    Thanks again for allowing [facility]   Emergency Department to serve you.

## 2019-04-14 NOTE — ED Provider Notes (Signed)
[facility]  EMERGENCY DEPARTMENT  History and Physical Exam       Patient Name: [name], [name]  Encounter Date:  04/14/2019  Attending Physician: Misty Stanley A. Nunzio Cory, MD  PCP: Blake Divine, MD  Patient DOB:  10-29-1940  MRN:  [id number]  Room:  C19/C19-A      History of Presenting Illness     Chief complaint: COVID Positive and Nausea      HPI/ROS is limited by: none  HPI/ROS given by: Patient    Zakya Kuzmin is a 78 y.o. female who presents with persistent nausea.  Patient was seen in the ED on 11/9 for a viral-type illness.  Her husband was evaluated at the same time for similar symptoms but in addition he had shortness of breath.  They both ended up testing positive for COVID.  She was discharged from the ED on oral meds for nausea, not sure which one.    No vomiting.  No diarrhea.  No change in taste or smell.  No cough.  No fever.  No myalgias.  No change in urination.  Poor appetite.       Review of Systems     Review of Systems   Constitutional: Positive for appetite change. Negative for fatigue and fever.   HENT: Negative for rhinorrhea and sore throat.         No change in taste/smell   Respiratory: Negative for cough and shortness of breath.    Cardiovascular: Negative for chest pain.   Gastrointestinal: Positive for nausea. Negative for diarrhea and vomiting.   Musculoskeletal: Negative for myalgias.   Neurological: Negative for headaches.   All other systems reviewed and are negative.       Allergies & Medications     Pt has No Known Allergies.    Discharge Medication List as of 04/14/2019  2:11 PM      CONTINUE these medications which have NOT CHANGED    Details   latanoprost (XALATAN) 0.005 % ophthalmic solution 1 drop nightly., Historical Med      Multiple Vitamin (MULTIVITAMIN) tablet Take 1 tablet by mouth daily., Historical Med      ondansetron (Zofran ODT) 8 MG disintegrating tablet Take 0.5-1 tablets (4-8 mg total) by mouth every 8 (eight) hours as needed for Nausea,  Starting Mon 04/07/2019, E-Rx      rosuvastatin (CRESTOR) 10 MG tablet Take 10 mg by mouth daily., Historical Med      valsartan-hydrochlorothiazide (DIOVAN-HCT) 80-12.5 MG per tablet Take 2 tablets by mouth daily. 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     Past Surgical History     Pt  has a past surgical history that includes Cataract extraction, bilateral; Dilation and curettage of uterus; Skin cancer excision (2013); Pyloroplasty; Retinal detachment surgery; Tonsillectomy and adenoidectomy; Tubal ligation; TUBAL LIGATION REVERSAL; and EGD (N/A, 11/10/2016).     Family History     The family history includes Heart disease in her mother; No known problems in her father.     Social History     Pt reports that she  has quit smoking. She has never used smokeless tobacco. She reports current alcohol use. She reports that she does not use drugs.     Physical Exam     Blood pressure 147/62, pulse 79, temperature 98.8 F (37.1 C), temperature source Oral, resp. rate 18, height 1.6 m, weight 63.5 kg, SpO2 97 %.    Physical Exam  Vitals signs and nursing note reviewed.   Constitutional:       General: She is not in acute distress.  HENT:      Head: Normocephalic.      Mouth/Throat:      Mouth: Mucous membranes are dry.   Neck:      Musculoskeletal: Normal range of motion.   Cardiovascular:      Rate and Rhythm: Normal rate.   Pulmonary:      Effort: Pulmonary effort is normal. No respiratory distress.   Abdominal:      Palpations: Abdomen is soft.    Skin:     General: Skin is warm and dry.   Neurological:      Mental Status: She is alert and oriented to person, place, and time.   Psychiatric:         Mood and Affect: Mood normal.          Diagnostic Results     The results of the diagnostic studies below have been reviewed by myself:    Labs  Labs Reviewed   CBC AND DIFFERENTIAL - Abnormal; Notable for the following components:       Result Value    PLT CT 504 (*)     All other components within normal limits   BASIC METABOLIC PANEL - Abnormal; Notable for the following components:    Glucose 130 (*)     All other components within normal limits   HEPATIC FUNCTION PANEL - Abnormal; Notable for the following components:    Bilirubin Direct 0.4 (*)     All other components within normal limits   D-DIMER, QUANTITATIVE - Abnormal; Notable for the following components:    D-Dimer 0.82 (*)     All other components within normal limits   TROPONIN I              Medical Decision Making     The differential diagnosis includes, but is not limited to gastroenteritis, food-borne toxins, volume depletion, dehydration, GERD, acute appendicitis, DKA, meningitis, migraine, vestibular dysfunction, bowel obstruction, paralytic ileus, gastroparesis, pancreatitis, enteritis/ dysentery, adverse reaction to or withdrawal from medication, electrolyte abnormality, urinary tract infection, COVID-19 infection, interstitial viral pneumonia, acute hypoxemic respiratory failure, cardiomyopathy, arrhythmias, DIC, ARDS, pleurisy, anxiety    This chart was generated by an EMR and may contain errors or omissions not intended by the user.  Use of PPE limits accuracy of Dragon voice recognition and spelling errors and random word insertions are unfortunately common.    ED Course & Treatment     Previous records reviewed.  D/dx, workup, anticipated clinical course discussed with patient/family.  Results reviewed with patient/family.  Tolerated PO fluids in the ED but stated even though she felt  better she was still nauseated despite medication.  She was able to urinate while she was here.  She is asking to go home.  Will call in both Compazine and Reglan.  She can try 1 and then the other if needed.  Strict return precautions given.  Patient appreciative of care.  All questions answered.  Patient/family comfortable with treatment plan.  Medications   sodium chloride 0.9 % bolus 1,000 mL (0 mLs Intravenous Stopped 04/14/19 1146)   ondansetron (ZOFRAN) injection 4 mg (4 mg Intravenous Given 04/14/19 1116)   prochlorperazine (COMPAZINE) injection 10 mg (10 mg Intravenous Given 04/14/19 1221)   metoclopramide (REGLAN) injection 10 mg (10 mg Intravenous Given 04/14/19 1309)   sodium chloride 0.9 % bolus 1,000 mL (0 mLs Intravenous Stopped 04/14/19 1404)   ondansetron (ZOFRAN) 4 MG/2ML injection (has no administration in time range)   prochlorperazine (COMPAZINE) 10 MG/2ML injection (has no administration in time range)   metoclopramide (REGLAN) 5 MG/ML injection (has no administration in time range)             Procedures / Critical Care     None     Diagnosis / Disposition     Clinical Impression  1. COVID-19    2. Nausea        Disposition  ED Disposition     ED Disposition Condition Date/Time Comment    Discharge  Mon Apr 14, 2019  2:11 PM Annyssa Moffit discharge to home/self care.    Condition at disposition: Stable            Follow up for Discharged Patients  [facility] Emergency Department  [address]  [place] [address]  [phone]    If symptoms worsen    Blake Divine, MD  [address]  [place] [address]  [phone]      As needed      Prescriptions for Discharged Patients  Discharge Medication List as of 04/14/2019  2:11 PM      START taking these medications    Details   metoclopramide (Reglan) 10 MG tablet Take 1 tablet (10 mg total) by mouth 4 (four) times daily as needed (nausea), Starting Mon 04/14/2019, E-Rx      prochlorperazine (COMPAZINE) 10 MG tablet Take 1  tablet (10 mg total) by mouth every 6 (six) hours as needed (nausea) May take with benadryl, motrin, and/or tylenol, Starting Mon 04/14/2019, E-Rx                       Clovis Riley, MD  04/14/19 1549

## 2019-09-16 ENCOUNTER — Emergency Department
Admission: EM | Admit: 2019-09-16 | Discharge: 2019-09-16 | Disposition: A | Discharge status: Home / Self Care | Payer: Medicare PPO | Attending: Emergency Medicine | Admitting: Emergency Medicine

## 2019-09-16 DIAGNOSIS — H209 Unspecified iridocyclitis: Secondary | ICD-10-CM | POA: Insufficient documentation

## 2019-09-16 MED ORDER — FLUORESCEIN SODIUM 1 MG OP STRP
ORAL_STRIP | OPHTHALMIC | Status: AC
Start: 2019-09-16 — End: ?
  Filled 2019-09-16: qty 1

## 2019-09-16 MED ORDER — PROPARACAINE HCL 0.5 % OP SOLN
1.00 [drp] | Freq: Once | OPHTHALMIC | Status: AC
Start: 2019-09-16 — End: 2019-09-16
  Administered 2019-09-16: 11:00:00 1 [drp] via OPHTHALMIC

## 2019-09-16 MED ORDER — PROPARACAINE HCL 0.5 % OP SOLN
OPHTHALMIC | Status: AC
Start: 2019-09-16 — End: ?
  Filled 2019-09-16: qty 15

## 2019-09-16 MED ORDER — FLUORESCEIN SODIUM 1 MG OP STRP
1.00 | ORAL_STRIP | Freq: Once | OPHTHALMIC | Status: AC
Start: 2019-09-16 — End: 2019-09-16
  Administered 2019-09-16: 11:00:00 1 via OPHTHALMIC

## 2019-09-16 NOTE — ED Provider Notes (Signed)
[facility]  EMERGENCY DEPARTMENT  History and Physical Exam       Patient Name: [name], [name]  Encounter Date:  09/16/2019  Attending Physician: Misty Stanley A. Nunzio Cory, MD  PCP: Blake Divine, MD  Patient DOB:  [birth year]  MRN:  [id number]  Room:  W41/W41-A      History of Presenting Illness     Chief complaint: Eye Problem      HPI/ROS is limited by: none  HPI/ROS given by: Patient and Spouse    [name] is a 79 y.o. female who presents with worsening blurry vision and eye redness.  Patient states she had blurry vision which started approximately a month ago.  She has been seen by her ophthalmologist for this.  She was on some medications but she is not sure what they were for.  Saw her doctor again in follow-up and was told to stop the prednisone drops which she did.  Since then her symptoms have gotten worse.  She states she has continued with her latanoprost and combigan gtts.    Denies headache.  She denies fever.  No jaw claudication.  No photophobia.  No sneezing.  States her eyes are not itchy.  No chemical exposures.  Has not been working outside.  Says in the morning her eyes have discharge and are usually stuck shut and she uses a warm washcloth.    Was wearing contacts but stopped last week and has been using her glasses.  Describes her vision as "blurry" and says that she cannot drive because of this.         Review of Systems     Review of Systems   Constitutional: Negative for chills and fever.   HENT: Negative for congestion, facial swelling, rhinorrhea, sinus pain and sneezing.    Eyes: Positive for discharge, redness and visual disturbance. Negative for photophobia, pain and itching.   Respiratory: Negative for cough.    Gastrointestinal: Negative for abdominal pain.   Skin: Negative for rash.   Neurological: Negative for headaches.   All other systems reviewed and are negative.       Allergies & Medications     Pt has No Known Allergies.    Discharge Medication List as of  09/16/2019 12:27 PM      CONTINUE these medications which have NOT CHANGED    Details   latanoprost (XALATAN) 0.005 % ophthalmic solution 1 drop nightly., Historical Med      metoclopramide (Reglan) 10 MG tablet Take 1 tablet (10 mg total) by mouth 4 (four) times daily as needed (nausea), Starting Mon 04/14/2019, E-Rx      Multiple Vitamin (MULTIVITAMIN) tablet Take 1 tablet by mouth daily., Historical Med      ondansetron (Zofran ODT) 8 MG disintegrating tablet Take 0.5-1 tablets (4-8 mg total) by mouth every 8 (eight) hours as needed for Nausea, Starting Mon 04/07/2019, E-Rx      prochlorperazine (COMPAZINE) 10 MG tablet Take 1 tablet (10 mg total) by mouth every 6 (six) hours as needed (nausea) May take with benadryl, motrin, and/or tylenol, Starting Mon 04/14/2019, E-Rx      rosuvastatin (CRESTOR) 10 MG tablet Take 10 mg by mouth daily., Historical Med      valsartan-hydrochlorothiazide (DIOVAN-HCT) 80-12.5 MG per tablet Take 2 tablets by mouth daily. 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     Past Surgical History     Pt  has a past surgical history that includes Cataract extraction, bilateral; Dilation and curettage of uterus; Skin cancer excision (2013); Pyloroplasty; Retinal detachment surgery; Tonsillectomy and adenoidectomy; Tubal ligation; TUBAL LIGATION REVERSAL; and EGD (N/A, 11/10/2016).     Family History     The family history includes Heart disease in her mother; No known problems in her father.     Social History     Pt reports  that she has quit smoking. She has never used smokeless tobacco. She reports current alcohol use. She reports that she does not use drugs.     Physical Exam     Blood pressure 196/80, pulse 66, temperature 97.6 F (36.4 C), temperature source Skin, resp. rate 18, height 1.6 m, weight 63.3 kg, SpO2 97 %.    Physical Exam  Vitals and nursing note reviewed.   Constitutional:       General: She is not in acute distress.     Appearance: She is well-developed.   HENT:      Head: Normocephalic and atraumatic.   Eyes:      General: Lids are normal. Vision grossly intact.         Right eye: Discharge (clear) present. No foreign body.         Left eye: Discharge (clear) present.No foreign body.      Extraocular Movements: Extraocular movements intact.      Conjunctiva/sclera:      Right eye: Right conjunctiva is injected. Chemosis present. No exudate.     Left eye: Left conjunctiva is injected. Chemosis present. No exudate.     Pupils: Pupils are unequal (L 3mm, R 2mm reactive).      Right eye: Pupil is not sluggish. No corneal abrasion or fluorescein uptake.      Left eye: Pupil is not sluggish. No corneal abrasion or fluorescein uptake.      Funduscopic exam:     Right eye: No exudate.         Left eye: Exudate present.      Comments: 20/70 OS, OD with glasses   Pulmonary:      Effort: No respiratory distress.   Musculoskeletal:      Cervical back: Normal range of motion.   Lymphadenopathy:      Cervical: Cervical adenopathy present.   Skin:     General: Skin is warm and dry.   Neurological:      General: No focal deficit present.      Mental Status: She is alert.   Psychiatric:         Mood and Affect: Mood normal.          Diagnostic Results     The results of the diagnostic studies below have been reviewed by myself:    None         Medical Decision Making     The differential diagnosis includes, but is not limited to conjunctivitis, hordeolum/ chalazion, dacrocystitis, periorbital cellulitis, orbital cellulitis,  pinguecula, pterygium, acute angle closure glaucoma, uveitis, herpes zoster, herpes simplex, corneal ulcer, CRVO, CRAO, retinal detachment, optic neuritis, papiledema    This chart was generated by an EMR and may  contain errors or omissions not intended by the user.  Use of PPE limits accuracy of Dragon voice recognition and spelling errors and random word insertions are unfortunately common.    ED Course & Treatment     Previous records reviewed.  D/dx, workup, anticipated clinical course discussed with patient/family.  Fortunately, both slit-lamp's are damaged and are out for repair.  The Tono-Pen is also in need of repair.  [employer] was able to bring me a second Tono-Pen but at this time the patient states she has another doctor's appointment and needs to leave.    I do not see clinical evidence of acute angle glaucoma.  There is no evidence of retinal detachment.  Fluorescein exam unremarkable.  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    I was able to speak to the receptionist at her ophthalmology office and they will be able to work her in later this afternoon.        Procedures / Critical Care     None     Diagnosis / Disposition     Clinical Impression  1. Anterior uveitis        Disposition  ED Disposition     ED Disposition Condition Date/Time Comment    Discharge  Tue Sep 16, 2019 12:26 PM [name] discharge to home/self care.    Condition at disposition: Stable            Follow up for Discharged Patients  Thomes Cake, MD  [address]  [place] [address]  [phone]      today at 3:10pm      Prescriptions for Discharged Patients  Discharge Medication List as of 09/16/2019 12:27 PM                    Clovis Riley, MD  09/16/19 1351

## 2019-09-16 NOTE — ED Triage Notes (Signed)
Pt reports bilateral eye "watering" and redness x2 weeks. Denies any pain or itching. Reports blurry vision that is getting worse everyday. Reports she wears contacts but has not worn them since last week.

## 2019-09-16 NOTE — Discharge Instructions (Signed)
Uveitis  Uveitis is inflammation inside the eye. This can be caused by injury to the eye or disease elsewhere in the body. Diseases linked to uveitis include Lyme disease, rheumatoid arthritis, and inflammatory bowel disease. In many cases, the cause of uveitis cant be found.   Depending on which part of the eye is affected and the cause, symptoms may last a few days or a few weeks. This is the most common type. In some cases, it may take months or years to resolve. In severe ongoing (chronic) cases, vision may be affected permanently.   Symptoms include eye pain, redness, blurred vision, sensitivity to light, and floating spots in your vision. This may occur in one or both eyes. Treatment depends on the severity of symptoms and the cause of the uveitis.   Home care   Use medicines as prescribed.   Wear sunglasses to decrease light sensitivity and discomfort.   If your eye is dilated, your driving ability may be affected. Don't drive until the blurred vision wears off.   You may use acetaminophen or ibuprofen to control pain, unless another pain medicine was prescribed. (Note: If you have chronic liver or kidney disease, or if you have ever had a stomach ulcer or gastrointestinal bleeding, talk with your healthcare provider before using these medicines.)    Follow-up care  Follow up with your healthcare provider, or as advised. You may need to see an eye specialist. Further testing will be needed to evaluate severe or lasting symptoms.   When to get medical advice  Call your healthcare provider right away if any of these occur:   Increasing eye pain   Eye redness   Sudden, partial, or complete vision loss in one or both eyes  StayWell last reviewed this educational content on 10/28/2018   2000-2020 [employer], [place]. [address] [place], [place] [address]. All rights reserved. This information is not intended as a substitute for professional medical care. Always follow your healthcare  professional's instructions.        Thank you for choosing [facility] for your emergency care needs. We strive to provide EXCELLENT care to you and your family.      YOUR ACCURATE CONTACT INFORMATION IS VERY IMPORTANT    Before leaving please check with registration to make sure we have an up-to-date contact number. If you have billing or insurance questions, call [phone].    IF YOU DO NOT CONTINUE TO IMPROVE OR YOUR CONDITION WORSENS, PLEASE CONTACT YOUR DOCTOR OR RETURN IMMEDIATELY TO THE EMERGENCEY DEPARTMENT.    EXTRA AVAILABLE RESOURCES:     DOCTOR REFERRALS  If you need  further assistance with referrals to primary care or specialty services our Nurse Navigator [facility] Carlena Hurl) may assist you at [phone].   FREE HEALTH SERVICES  [email or web address]  May be utilized if you need help with health or social services, please call 2-1-1 for a free referral to resources in your area. 2-1-1 is a free service connecting people with information on health insurance, free clinics, pregnancy, mental health, dental care, food assistance, housing, and substance abuse counseling.   MEDICAL RECORDS AND TESTS  Certain laboratory test results do not come back the same day, for example: urine cultures may take 3 days. We will attempt to contact you if other important findings are noted. Some lab testing may take 2-5 days. Radiology films are reviewed again to ensure accuracy. If there is any discrepancy, we will notify you.  MY CHART  free, easy, secure way to view portions of your health information.  For assistance or to create an account, call [phone], option 4   RESPIRATORY CARE CLINIC  call [phone] if you have respiratory illness or symptoms, or questions about testing for the novel coronavirus   DISASTER DISTRESS HELP LINE  If you, or someone you care about, are feeling overwhelmed with emotions like sadness, depression, or anxiety, call [phone] or text  TalkWithUs to [phone]       DISCHARGE MESSAGE:    YOU ARE THE MOST IMPORTANT FACTOR IN YOUR RECOVERY. Follow the above instructions carefully.  Take your medicines as prescribed. Most important, see your  doctor in follow-up as recommended by your ED physician.  Call your doctor if your condition worsens or if you have any new, worsening, or severe symptoms. If you need further care and cannot be seen by your recommended follow-up doctor, the Emergency Department Nurse Navigator [facility] Carlena Hurl) will be available to help as needed [phone].  If you require immediate assistance, return to the Emergency Department or call 911.    Thanks again for allowing [facility]   Emergency Department to serve you.

## 2019-09-24 ENCOUNTER — Ambulatory Visit
Admission: RE | Admit: 2019-09-24 | Discharge: 2019-09-24 | Disposition: A | Discharge status: Home / Self Care | Payer: Medicare PPO | Source: Ambulatory Visit | Attending: Ophthalmology | Admitting: Ophthalmology

## 2019-09-24 ENCOUNTER — Inpatient Hospital Stay: Admission: RE | Admit: 2019-09-24 | Payer: Self-pay | Source: Ambulatory Visit

## 2019-09-24 DIAGNOSIS — H209 Unspecified iridocyclitis: Secondary | ICD-10-CM | POA: Insufficient documentation

## 2019-09-24 DIAGNOSIS — H20029 Recurrent acute iridocyclitis, unspecified eye: Secondary | ICD-10-CM | POA: Insufficient documentation

## 2019-09-24 LAB — COMPREHENSIVE METABOLIC PANEL
ALT: 13 U/L (ref 0–55)
AST (SGOT): 22 U/L (ref 10–42)
Albumin/Globulin Ratio: 1.67 Ratio (ref 0.80–2.00)
Albumin: 4 gm/dL (ref 3.5–5.0)
Alkaline Phosphatase: 51 U/L (ref 40–145)
Anion Gap: 12.8 mMol/L (ref 7.0–18.0)
BUN / Creatinine Ratio: 28.8 Ratio (ref 10.0–30.0)
BUN: 23 mg/dL — ABNORMAL HIGH (ref 7–22)
Bilirubin, Total: 0.7 mg/dL (ref 0.1–1.2)
CO2: 29 mMol/L (ref 20–30)
Calcium: 9.5 mg/dL (ref 8.5–10.5)
Chloride: 101 mMol/L (ref 98–110)
Creatinine: 0.8 mg/dL (ref 0.60–1.20)
EGFR: 71 mL/min/{1.73_m2} (ref 60–150)
Globulin: 2.4 gm/dL (ref 2.0–4.0)
Glucose: 114 mg/dL — ABNORMAL HIGH (ref 71–99)
Osmolality Calculated: 282 mOsm/kg (ref 275–300)
Potassium: 3.8 mMol/L (ref 3.5–5.3)
Protein, Total: 6.4 gm/dL (ref 6.0–8.3)
Sodium: 139 mMol/L (ref 136–147)

## 2019-09-24 LAB — C-REACTIVE PROTEIN: C-Reactive Protein: 0.3 mg/dL (ref 0.02–0.80)

## 2019-09-24 LAB — CBC AND DIFFERENTIAL
Basophils %: 0.8 % (ref 0.0–3.0)
Basophils Absolute: 0 10*3/uL (ref 0.0–0.3)
Eosinophils %: 2.1 % (ref 0.0–7.0)
Eosinophils Absolute: 0.1 10*3/uL (ref 0.0–0.8)
Hematocrit: 42.5 % (ref 36.0–48.0)
Hemoglobin: 13.1 gm/dL (ref 12.0–16.0)
Lymphocytes Absolute: 2.2 10*3/uL (ref 0.6–5.1)
Lymphocytes: 39.8 % (ref 15.0–46.0)
MCH: 32 pg (ref 28–35)
MCHC: 31 gm/dL — ABNORMAL LOW (ref 32–36)
MCV: 103 fL — ABNORMAL HIGH (ref 80–100)
MPV: 7.6 fL (ref 6.0–10.0)
Monocytes Absolute: 0.5 10*3/uL (ref 0.1–1.7)
Monocytes: 8.7 % (ref 3.0–15.0)
Neutrophils %: 48.5 % (ref 42.0–78.0)
Neutrophils Absolute: 2.7 10*3/uL (ref 1.7–8.6)
PLT CT: 274 10*3/uL (ref 130–440)
RBC: 4.13 10*6/uL (ref 3.80–5.00)
RDW: 11.8 % (ref 11.0–14.0)
WBC: 5.6 10*3/uL (ref 4.0–11.0)

## 2019-09-24 LAB — URINALYSIS WITH MICROSCOPIC, REFLEX COMPREHENSIVE URINE CULTURE
Bilirubin, UA: NEGATIVE
Blood, UA: NEGATIVE
Glucose, UA: NEGATIVE mg/dL
Ketones UA: NEGATIVE mg/dL
Leukocyte Esterase, UA: NEGATIVE Leu/uL
Nitrite, UA: NEGATIVE
Protein, UR: NEGATIVE mg/dL
RBC, UA: 1 /hpf (ref 0–5)
Squam Epithel, UA: <1 /hpf (ref 0–2)
Urine Specific Gravity: 1.02 (ref 1.001–1.040)
Urobilinogen, UA: NORMAL mg/dL
WBC, UA: 2 /hpf (ref 0–4)
pH, Urine: 5 pH (ref 5.0–8.0)

## 2019-09-24 LAB — C3 COMPLEMENT: Complement C3: 103 mg/dL (ref 83–193)

## 2019-09-24 LAB — SEDIMENTATION RATE: Sed Rate: 10 mm/hr (ref 0–30)

## 2019-09-24 LAB — C4 COMPLEMENT: Complement 4: 26 mg/dL (ref 15–57)

## 2019-09-24 LAB — RHEUMATOID FACTOR: Rheumatoid Factor: 36.8 IU/mL — ABNORMAL HIGH (ref 0.0–29.9)

## 2019-09-24 LAB — URIC ACID: Uric acid: 6.9 mg/dL — ABNORMAL HIGH (ref 2.6–6.0)

## 2019-09-25 LAB — LYME AB, TOTAL,REFLEX TO WESTERN BLOT (IGG & IGM): Lyme AB: NONREACTIVE

## 2019-09-26 LAB — QUANTIFERON(R)-TB GOLD PLUS
Mitogen-NIL: 7.48 IU/mL
NIL: 0.02 IU/mL
QuantiFERON-TB Plus, 1T: NEGATIVE
TB1-NIL: 0 IU/mL
TB2-NIL: 0 IU/mL

## 2019-09-26 LAB — ANTI-NEUTROPHILIC CYTOPLASMIC ANTIBODY
c-ANCA: NEGATIVE
p-ANCA: NEGATIVE

## 2019-09-26 LAB — FTA-ABS: FTA-ABS: NONREACTIVE

## 2019-09-29 LAB — ANGIOTENSIN CONVERTING ENZYME: Angiotensin-Converting Enzyme: 31 U/L (ref 9–67)

## 2019-10-01 LAB — ANA SCREEN, IFA, WITH REFLEX TO TITER AND PATTERN: ANA Screen, IFA: NEGATIVE

## 2019-11-20 ENCOUNTER — Other Ambulatory Visit (INDEPENDENT_AMBULATORY_CARE_PROVIDER_SITE_OTHER): Payer: Self-pay | Admitting: Pulmonary Disease

## 2019-12-18 ENCOUNTER — Encounter (INDEPENDENT_AMBULATORY_CARE_PROVIDER_SITE_OTHER): Payer: Self-pay | Admitting: Pulmonary Disease

## 2019-12-29 ENCOUNTER — Encounter (INDEPENDENT_AMBULATORY_CARE_PROVIDER_SITE_OTHER): Payer: Self-pay | Admitting: Pulmonary Disease

## 2019-12-29 ENCOUNTER — Ambulatory Visit (INDEPENDENT_AMBULATORY_CARE_PROVIDER_SITE_OTHER): Payer: Medicare PPO | Admitting: Pulmonary Disease

## 2019-12-29 VITALS — BP 160/80 | HR 64 | Temp 98.2°F | Resp 16 | Ht 62.0 in | Wt 141.1 lb

## 2019-12-29 DIAGNOSIS — R42 Dizziness and giddiness: Secondary | ICD-10-CM

## 2019-12-29 MED ORDER — MECLIZINE HCL 25 MG PO TABS
25.00 mg | ORAL_TABLET | Freq: Three times a day (TID) | ORAL | 1 refills | Status: AC | PRN
Start: 2019-12-29 — End: 2020-01-13

## 2019-12-29 NOTE — Progress Notes (Signed)
CC    c/o vertigo    HPI    Onset of dizziness about 4-6 weeks ago.  When she changes posture especially leaning backward she develops a spinning sensation and nausea without vomiting.  Leaning head backward is the worse movement that she makes that precipitates the symptoms.  No sinus problems or recent URI's.  No headaches.  No change in hearing or tinnititis.  Symptoms have gradually worsening.  No change in vision.  No similar problems in the past.    PREVIOUS HX (10/06/19) She recently has been in the emergency room and seen by ophthalmology.   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  The rheumatoid factor was mildly elevated but no joint problems.  She has no knnown systemic disease.  No arthralgias.  She is currently on prdnisoine eye drops.  Despite the few abnormalities of the serology she does not have evidence of rheuamatoid arthritis  She was referred to Dr. Kym Groom (rheumatologist) in [place].  She does have sensation of blurriness in both eyes.      PREVIOUS HX (07/15/19) Says that she has trouble with her memory.  Having trouble with remembering.  Essential HTN has been well controlled on the current medication.  No headaches, chest pain or change in lower extremity edema.  Hypercholesterolemia has been controlled on statin therapy.  Father with Alheimer's disease.    PREVIOUS HX (02/07/1941)  Recently hospitalized at [facility] in [place] with confusion.  W/u was negative except for a low potassium.  Started on KDur 20 mEq bid.  She was visiting her husband in the hospital in [place] and became disoriented.  Since returning home she has been fine and confirmed by her husband who is her today.      PREVIOUS HX (09/09/18)  No new complaints.  Essential HTN has been well controlled on the current medication.  No headaches, chest pain or change in lower extremity edema.  Hypercholesterolemia has been controlled on statin therapy.      ROS  Resp:   no unexplained dyspnea   CV:       no anginal-type chest pain  GI:       no dysphagia  Remainder of a 10-point ROS is negative.    PFSH  Medications and allergies reviewed and reconciled.  Problem list reviewed and updated.     HABITS  Smoking status: Former smoker [[id number]]    ALLERGIES   No Known Allergies     MEDICATIONS   Current Outpatient Medications   Medication Instructions    latanoprost (XALATAN) 0.005 % ophthalmic solution 1 drop, At bedtime    metoclopramide (REGLAN) 10 mg, Oral, 4 times daily PRN    Multiple Vitamin (MULTIVITAMIN) tablet 1 tablet, Daily    ondansetron (ZOFRAN ODT) 4-8 mg, Oral, Every 8 hours PRN    prochlorperazine (COMPAZINE) 10 mg, Oral, Every 6 hours PRN, May take with benadryl, motrin, and/or tylenol    rosuvastatin (CRESTOR) 10 MG tablet TAKE ONE TABLET BY MOUTH ONCE DAILY    valsartan-hydrochlorothiazide (DIOVAN-HCT) 80-12.5 MG per tablet 2 tablets, Daily     MEDICAL PROBLEMS  Essential HTN   Hypercholesterolemia        statin therapy  H/o nausea and abdominal pain       CT Abd/Pelvis (628/18) left renal cortical cysts; NAD       EGD (11/13/16) negative  Glaucoma  Chronic neuritis R thigh  Headaches       MRI Brain (09/12/16) mild chronic  microvascular changes       MRA Head (4/17/8) normal       MRA Neck (09/12/16) normal  Mild elevation of the uric acid       uric acid level (09/24/19) = 67.0  Osteopenia       DEXA scan (09/01/05) osteopenia       h/o bisphosphonate therapy  Left carpal tunnel syndrome  Screening colonoscopy; no fm h/o colon cancer       colonoscopy (09/11/07) hyperplastic polyp  Screening mammogram; no fm h/o breast cancer       fibrocystic breast disease       f/u Dr. Graciela Husbands (GYN)    SURGICAL HX  s/p repair pyloric stenosis; age 38  s/p tonsillectomy  s/p cataract extractions    Visit Vitals  BP 160/80 (BP Site: Left arm, Patient Position: Sitting, Cuff Size: Large)   Pulse 64   Temp 98.2 F (36.8 C) (Oral)   Resp 16   Ht 1.575 m (5\' 2" )   Wt 64 kg (141 lb 1.6 oz)   SpO2 99%   BMI 25.81 kg/m       EXAM     Gen/Psych:  well appearing; appropriate insight and affect; able to walk without difficulty  Eyes:  PERRLA; no nystagmus  Neck:  Symmetrical; thyroid normal; no LN enlargement; carotids full w/o bruits; ear canals and TM's normal  Resp:  CTA/P; nrl resp effort  Heart:  RRR; no murmurs   Skin:  Warm and dry with normal turgor  Ext:  No pedal edema; no digital cyanosis     DATA  Labs     CBC (09/24/19)  WBC = 5.6   HCT = 42.5   PLT = 274     BMP (12/24/19)   glu = 89   141/4.1/100/26   BUN/Cr =  15/0.63     CMP (428/21)   glu = 114   139/3.8/101/29   Bun/Cr = 23/0.80   LFT's = normal     Uric Acid (09/24/19) = 6.9     CRP (09/24/19) = 0.30     ESR (09/24/19) = 10     LDL (09/09/18) =  143      TSH (07/16/19) = 1.8     B12/folate (07/16/19) = 960/>20     ACE level (09/24/19) = 31     Urinalysis (09/24/19) normal     Lyme test (09/24/19) negative     ANCA (09/24/19) negative     Rheumatoid factor (09/24/19) 36.8     Quantiferone (09/24/19) negative    PA/LAT CXR (10/03/16) reviewed; NAD.      pCXR (04/07/19) normal chest.      Medical records reviewed included hospital records through Epic.       ASSESSMENT    Vertigo.  Symptoms for a 4-6 weeks and gradually worsening.  Exam is normal.  Trial of Meclizine and check head CT.    Essential HTN.  Borderline controlled.    Hypercholesterolemia. Controlled.    Osteopenia.    Borderline elevation of the uric without evicent of gout.      PLAN    No labs/test today.   Trail of Meclizine 25 mg tid prn vertigo.  Check CT Head w/ and w/o.  Continue with the other medications without change.   Routine office f/u in 6 months.  Will inform patient of CT Head.

## 2020-01-07 ENCOUNTER — Ambulatory Visit (INDEPENDENT_AMBULATORY_CARE_PROVIDER_SITE_OTHER)
Admission: RE | Admit: 2020-01-07 | Discharge: 2020-01-07 | Disposition: A | Discharge status: Home / Self Care | Payer: Medicare PPO | Source: Ambulatory Visit | Attending: Pulmonary Disease | Admitting: Pulmonary Disease

## 2020-01-07 DIAGNOSIS — R42 Dizziness and giddiness: Secondary | ICD-10-CM

## 2020-01-07 LAB — I-STAT CREATININE
Creatinine I-Stat: 0.6 mg/dL (ref 0.60–1.20)
EGFR: 88 mL/min/{1.73_m2} (ref 60–150)

## 2020-01-07 MED ORDER — IOHEXOL 350 MG/ML IV SOLN
75.00 mL | Freq: Once | INTRAVENOUS | Status: AC | PRN
Start: 2020-01-07 — End: 2020-01-07
  Administered 2020-01-07: 09:00:00 75 mL via INTRAVENOUS

## 2020-01-13 NOTE — Progress Notes (Signed)
cc   Dr. Iran Sizer  cc   [facility]    CC     f/u for essential HTN and hypercholesterolemia     HPI    Continues to have intermittent dizziness which is slightly better.  Head movement worsens the symptoms.  No N/V.  No falls.  The recent CT Head was unremarkable.  The Meclizine has not been very helpful.  No headaches or visual changes.  The blood pressure has been controlled.        PREVIOUS HX (12/29/19) Onset of dizziness about 4-6 weeks ago.  When she changes posture especially leaning backward she develops a spinning sensation and nausea without vomiting.  Leaning head backward is the worse movement that she makes that precipitates the symptoms.  No sinus problems or recent URI's.  No headaches.  No change in hearing or tinnititis.  Symptoms have gradually worsening.  No change in vision.  No similar problems in the past.    PREVIOUS HX (10/06/19) She recently has been in the emergency room and seen by ophthalmology.   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  The rheumatoid factor was mildly elevated but no joint problems.  She has no knnown systemic disease.  No arthralgias.  She is currently on prdnisoine eye drops.  Despite the few abnormalities of the serology she does not have evidence of rheuamatoid arthritis  She was referred to Dr. Kym Groom (rheumatologist) in [place].  She does have sensation of blurriness in both eyes.      PREVIOUS HX (07/15/19) Says that she has trouble with her memory.  Having trouble with remembering.  Essential HTN has been well controlled on the current medication.  No headaches, chest pain or change in lower extremity edema.  Hypercholesterolemia has been controlled on statin therapy.  Father with Alheimer's disease.    PREVIOUS HX (07/01/1940)  Recently hospitalized at [facility] in [place] with confusion.  W/u was negative except for a low potassium.  Started on KDur 20 mEq bid.  She was visiting her husband in the hospital in [place] and became  disoriented.  Since returning home she has been fine and confirmed by her husband who is her today.      PREVIOUS HX (09/09/18)  No new complaints.  Essential HTN has been well controlled on the current medication.  No headaches, chest pain or change in lower extremity edema.  Hypercholesterolemia has been controlled on statin therapy.      ROS  Resp:   no unexplained dyspnea   CV:      no anginal-type chest pain  GI:       no dysphagia  Remainder of a 10-point ROS is negative.    PFSH  Medications and allergies reviewed and reconciled.  Problem list reviewed and updated.     HABITS  Smoking status: Former smoker [[id number]]    ALLERGIES   No Known Allergies     MEDICATIONS   Current Outpatient Medications   Medication Instructions    brimonidine-timolol (Combigan) 0.2-0.5 % ophthalmic solution 1 drop    latanoprost (XALATAN) 0.005 % ophthalmic solution 1 drop, At bedtime    metoclopramide (REGLAN) 10 mg, Oral, 4 times daily PRN    Multiple Vitamin (MULTIVITAMIN) tablet 1 tablet, Daily    ondansetron (ZOFRAN ODT) 4-8 mg, Oral, Every 8 hours PRN    prednisoLONE acetate (PRED FORTE) 1 % ophthalmic suspension No dose, route, or frequency recorded.    prochlorperazine (COMPAZINE) 10 mg, Oral, Every  6 hours PRN, May take with benadryl, motrin, and/or tylenol    rosuvastatin (CRESTOR) 10 MG tablet TAKE ONE TABLET BY MOUTH ONCE DAILY    travoprost, benzalkonium free, (TRAVATAN Z) 0.004 % ophthalmic solution No dose, route, or frequency recorded.    valsartan-hydroCHLOROthiazide (DIOVAN-HCT) 160-25 MG per tablet 1 tablet, Oral, Daily     MEDICAL PROBLEMS  Essential HTN   Hypercholesterolemia        statin therapy  H/o nausea and abdominal pain       CT Abd/Pelvis (628/18) left renal cortical cysts; NAD       EGD (11/13/16) negative  Glaucoma  Chronic neuritis R thigh  Headaches       MRI Brain (09/12/16) mild chronic microvascular changes       MRA Head (4/17/8) normal       MRA Neck (09/12/16) normal  Mild elevation of  the uric acid       uric acid level (09/24/19) = 67.0  Osteopenia       DEXA scan (09/01/05) osteopenia       h/o bisphosphonate therapy  Left carpal tunnel syndrome  Screening colonoscopy; no fm h/o colon cancer       colonoscopy (09/11/07) hyperplastic polyp  Screening mammogram; no fm h/o breast cancer       fibrocystic breast disease       f/u Dr. Graciela Husbands (GYN)    SURGICAL HX  s/p repair pyloric stenosis; age 56  s/p tonsillectomy  s/p cataract extractions      Visit Vitals  BP 122/72 (BP Site: Left arm, Patient Position: Sitting, Cuff Size: Large)   Pulse 64   Resp 17   Ht 1.575 m (5\' 2" )   Wt 66.4 kg (146 lb 4.8 oz)   SpO2 98%   BMI 26.76 kg/m     EXAM     Gen/Psych:  well appearing; appropriate insight and affect   Neck:  Symmetrical; thyroid normal; no LN enlargement; carotids full w/o bruits  Resp:  CTA/P; nrl resp effort; no chest wall deformity or tenderness  Heart:  RRR; no murmurs   Skin:  Warm and dry with normal turgor; no rashes  Ext:  No pedal edema; no digital cyanosis     DATA  Labs     CBC (09/24/19)  WBC = 5.6   HCT = 42.5   PLT = 274     CMP (428/21)   glu = 114   139/3.8/101/29   Bun/Cr = 23/0.80   LFT's = normal     Uric Acid (09/24/19) = 6.9     CRP (09/24/19) = 0.30     ESR (09/24/19) = 10     LDL (09/09/18) =  143      TSH (07/16/19) = 1.8     B12/folate (07/16/19) = 960/>20     ACE level (09/24/19) = 31     Urinalysis (09/24/19) normal     Lyme test (09/24/19) negative     ANCA (09/24/19) negative     Rheumatoid factor (09/24/19) 36.8     Quantiferone (09/24/19) negative    PA/LAT CXR (10/03/16) reviewed; NAD.      pCXR (04/07/19) normal chest.      CT Head (01/07/20) The examination redemonstrates chronic small vessel ischemic change   involving the periventricular white matter of both cerebral hemispheres.   No hemorrhage or cerebral edema is identified. No abnormal enhancement is   seen on the postinfusion images. The   examination redemonstrates change  consistent with prior banding of the   right  globe.    Medical records reviewed included hospital records through Epic.       ASSESSMENT    Intermittent vertigo.  Head CT negative.  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      Essential HTN.  BP a little higher today at 160/80 = bilaterally.  No change in medication.    Hypercholesterolemia. Controlled.    Osteopenia.    Borderline elevation of the uric without evicent of gout.      PLAN    No labs/tests today.   Continue with the other medications without change.   Routine office f/u in 6 months.

## 2020-01-14 ENCOUNTER — Encounter (INDEPENDENT_AMBULATORY_CARE_PROVIDER_SITE_OTHER): Payer: Self-pay | Admitting: Pulmonary Disease

## 2020-01-14 ENCOUNTER — Ambulatory Visit (INDEPENDENT_AMBULATORY_CARE_PROVIDER_SITE_OTHER): Payer: Medicare PPO | Admitting: Pulmonary Disease

## 2020-01-14 VITALS — BP 122/72 | HR 64 | Resp 17 | Ht 62.0 in | Wt 146.3 lb

## 2020-01-14 DIAGNOSIS — I10 Essential (primary) hypertension: Secondary | ICD-10-CM

## 2020-01-14 DIAGNOSIS — E7800 Pure hypercholesterolemia, unspecified: Secondary | ICD-10-CM

## 2020-07-05 NOTE — Progress Notes (Signed)
CC     f/u for essential HTN and hypercholesterolemia     HPI    Doing okay except for "my memory".  Frequently forgetful.  Loosing things and cannot remember when she placed things.  Able to cook and maintain personal hygiene.  Has tried Prevegen without improvement.  Father had dementia and lived into his 13's.  The blood pressure and the cholesterol have been controlled.    PREVIOUS HX (01/14/20) Continues to have intermittent dizziness which is slightly better.  Head movement worsens the symptoms.  No N/V.  No falls.  The recent CT Head was unremarkable.  The Meclizine has not been very helpful.  No headaches or visual changes.  The blood pressure has been controlled.    PREVIOUS HX (12/29/19) Onset of dizziness about 4-6 weeks ago.  When she changes posture especially leaning backward she develops a spinning sensation and nausea without vomiting.  Leaning head backward is the worse movement that she makes that precipitates the symptoms.  No sinus problems or recent URI's.  No headaches.  No change in hearing or tinnititis.  Symptoms have gradually worsening.  No change in vision.  No similar problems in the past.    PREVIOUS HX (10/06/19) She recently has been in the emergency room and seen by ophthalmology.   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  The rheumatoid factor was mildly elevated but no joint problems.  She has no knnown systemic disease.  No arthralgias.  She is currently on prdnisoine eye drops.  Despite the few abnormalities of the serology she does not have evidence of rheuamatoid arthritis  She was referred to Dr. Kym Groom (rheumatologist) in [place].  She does have sensation of blurriness in both eyes.      PREVIOUS HX (07/15/19) Says that she has trouble with her memory.  Having trouble with remembering.  Essential HTN has been well controlled on the current medication.  No headaches, chest pain or change in lower extremity edema.  Hypercholesterolemia has been controlled on  statin therapy.  Father with Alheimer's disease.    ROS  Resp:   no unexplained dyspnea   CV:      no anginal-type chest pain  GI:       no dysphagia  Remainder of a 10-point ROS is negative.    PFSH  Medications and allergies reviewed and reconciled.  Problem list reviewed and updated.     ALLERGIES   No Known Allergies     MEDICATIONS   Current Outpatient Medications   Medication Instructions    brimonidine-timolol (Combigan) 0.2-0.5 % ophthalmic solution 1 drop, Both Eyes, 2 times daily    latanoprost (XALATAN) 0.005 % ophthalmic solution 1 drop, At bedtime    Multiple Vitamin (MULTIVITAMIN) tablet 1 tablet, Daily    prednisoLONE acetate (PRED FORTE) 1 % ophthalmic suspension 1 drop, Both Eyes, Every other day    rosuvastatin (CRESTOR) 10 MG tablet TAKE ONE TABLET BY MOUTH ONCE DAILY    travoprost, benzalkonium free, (TRAVATAN Z) 0.004 % ophthalmic solution 1 drop, Both Eyes, At bedtime    valsartan-hydroCHLOROthiazide (DIOVAN-HCT) 160-25 MG per tablet 1 tablet, Oral, Daily     MEDICAL PROBLEMS  Essential HTN   Hypercholesterolemia        statin therapy  H/o nausea and abdominal pain       CT Abd/Pelvis (628/18) left renal cortical cysts; NAD       EGD (11/13/16) negative  Glaucoma  Chronic neuritis R thigh  Headaches  MRI Brain (09/12/16) mild chronic microvascular changes       MRA Head (4/17/8) normal       MRA Neck (09/12/16) normal  Mild elevation of the uric acid       uric acid level (09/24/19) = 67.0  Osteopenia       DEXA scan (09/01/05) osteopenia       h/o bisphosphonate therapy  Left carpal tunnel syndrome  Ex-smoker  Screening colonoscopy; no fm h/o colon cancer       colonoscopy (09/11/07) hyperplastic polyp  Screening mammogram; no fm h/o breast cancer       fibrocystic breast disease       f/u Dr. Graciela Husbands (GYN)    SURGICAL HX  s/p repair pyloric stenosis; age 25  s/p tonsillectomy  s/p cataract extractions      Visit Vitals  BP 152/70   Pulse 64   Resp 16   Ht 1.6 m (5\' 3" )   Wt 66.7 kg (147  lb)   SpO2 97%   BMI 26.04 kg/m     EXAM     Gen/Psych:  well appearing; appropriate insight and affect   Neck:  Symmetrical; thyroid normal; no LN enlargement; carotids full w/o bruits  Resp:  CTA/P; nrl resp effort; no chest wall deformity or tenderness  Heart:  RRR; no murmurs   Skin:  Warm and dry with normal turgor; no rashes  Ext:  No pedal edema; no digital cyanosis     DATA  Labs     CBC (09/24/19)  WBC = 5.6   HCT = 42.5   PLT = 274     CMP (428/21)   glu = 114   139/3.8/101/29   Bun/Cr = 23/0.80   LFT's = normal     LDL (09/09/18) =  143      TSH (07/16/19) = 1.8     B12/folate (07/16/19) = 960/>20     Uric Acid (09/24/19) = 6.9       CRP (09/24/19) = 0.30     ESR (09/24/19) = 10     ACE level (09/24/19) = 31     Urinalysis (09/24/19) normal     Lyme test (09/24/19) negative     ANCA (09/24/19) negative     Rheumatoid factor (09/24/19) 36.8     Quantiferone (09/24/19) negative    PA/LAT CXR (10/03/16) reviewed; NAD.      pCXR (04/07/19) normal chest.      CT Head (01/07/20) The examination redemonstrates chronic small vessel ischemic change involving the periventricular white matter of both cerebral hemispheres. No hemorrhage or cerebral edema is identified. No abnormal enhancement is seen on the postinfusion images. The examination redemonstrates change consistent with prior banding of the right globe.    Medical records reviewed included hospital records through Epic.       ASSESSMENT    Memory problems.  Trial of Donepezil 5 mg once daily.    Essential HTN.  BP a little higher today at 160/80 = bilaterally.  No change in medication.    Hypercholesterolemia. Controlled.    Osteopenia.    Borderline elevation of the uric without evicent of gout.    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        PLAN    Check CMP, CBC, Lipid Panel  and TSH.  Start Donepezil 5 mg once daily.  Continue with the other medications without change.   Routine office f/u in 6 months.

## 2020-07-06 ENCOUNTER — Encounter (INDEPENDENT_AMBULATORY_CARE_PROVIDER_SITE_OTHER): Payer: Self-pay | Admitting: Pulmonary Disease

## 2020-07-06 ENCOUNTER — Ambulatory Visit (INDEPENDENT_AMBULATORY_CARE_PROVIDER_SITE_OTHER): Payer: Medicare PPO | Admitting: Pulmonary Disease

## 2020-07-06 ENCOUNTER — Other Ambulatory Visit
Admission: RE | Admit: 2020-07-06 | Discharge: 2020-07-06 | Disposition: A | Discharge status: Home / Self Care | Payer: Medicare PPO | Source: Ambulatory Visit | Attending: Pulmonary Disease | Admitting: Pulmonary Disease

## 2020-07-06 VITALS — BP 152/70 | HR 64 | Resp 16 | Ht 63.0 in | Wt 147.0 lb

## 2020-07-06 DIAGNOSIS — E7800 Pure hypercholesterolemia, unspecified: Secondary | ICD-10-CM

## 2020-07-06 DIAGNOSIS — R413 Other amnesia: Secondary | ICD-10-CM

## 2020-07-06 DIAGNOSIS — I10 Essential (primary) hypertension: Secondary | ICD-10-CM

## 2020-07-06 LAB — COMPREHENSIVE METABOLIC PANEL
ALT: 21 U/L (ref 0–55)
AST (SGOT): 28 U/L (ref 10–42)
Albumin/Globulin Ratio: 1.59 Ratio (ref 0.80–2.00)
Albumin: 4.3 gm/dL (ref 3.5–5.0)
Alkaline Phosphatase: 53 U/L (ref 40–145)
Anion Gap: 12.7 mMol/L (ref 7.0–18.0)
BUN / Creatinine Ratio: 22.7 Ratio (ref 10.0–30.0)
BUN: 17 mg/dL (ref 7–22)
Bilirubin, Total: 1 mg/dL (ref 0.1–1.2)
CO2: 32 mMol/L — ABNORMAL HIGH (ref 20–30)
Calcium: 10.5 mg/dL (ref 8.5–10.5)
Chloride: 101 mMol/L (ref 98–110)
Creatinine: 0.75 mg/dL (ref 0.60–1.20)
EGFR: 76 mL/min/{1.73_m2} (ref 60–150)
Globulin: 2.7 gm/dL (ref 2.0–4.0)
Glucose: 110 mg/dL — ABNORMAL HIGH (ref 71–99)
Osmolality Calculated: 285 mOsm/kg (ref 275–300)
Potassium: 3.7 mMol/L (ref 3.5–5.3)
Protein, Total: 7 gm/dL (ref 6.0–8.3)
Sodium: 142 mMol/L (ref 136–147)

## 2020-07-06 LAB — CBC AND DIFFERENTIAL
Basophils %: 0.7 % (ref 0.0–3.0)
Basophils Absolute: 0 10*3/uL (ref 0.0–0.3)
Eosinophils %: 2.2 % (ref 0.0–7.0)
Eosinophils Absolute: 0.2 10*3/uL (ref 0.0–0.8)
Hematocrit: 40.2 % (ref 36.0–48.0)
Hemoglobin: 13.8 gm/dL (ref 12.0–16.0)
Lymphocytes Absolute: 2.5 10*3/uL (ref 0.6–5.1)
Lymphocytes: 35.3 % (ref 15.0–46.0)
MCH: 35 pg (ref 28–35)
MCHC: 34 gm/dL (ref 32–36)
MCV: 101 fL — ABNORMAL HIGH (ref 80–100)
MPV: 7.9 fL (ref 6.0–10.0)
Monocytes Absolute: 0.5 10*3/uL (ref 0.1–1.7)
Monocytes: 7.8 % (ref 3.0–15.0)
Neutrophils %: 54.1 % (ref 42.0–78.0)
Neutrophils Absolute: 3.8 10*3/uL (ref 1.7–8.6)
PLT CT: 285 10*3/uL (ref 130–440)
RBC: 3.98 10*6/uL (ref 3.80–5.00)
RDW: 11.8 % (ref 11.0–14.0)
WBC: 7.1 10*3/uL (ref 4.0–11.0)

## 2020-07-06 LAB — LIPID PANEL
Cholesterol: 243 mg/dL — ABNORMAL HIGH (ref 75–199)
Coronary Heart Disease Risk: 4.05
HDL: 60 mg/dL (ref 45–65)
LDL Calculated: 153 mg/dL
Triglycerides: 152 mg/dL — ABNORMAL HIGH (ref 10–150)
VLDL: 30 (ref 0–40)

## 2020-07-06 LAB — TSH: TSH: 3.3 u[IU]/mL (ref 0.40–4.20)

## 2020-07-06 MED ORDER — DONEPEZIL HCL 5 MG PO TABS
5.0000 mg | ORAL_TABLET | Freq: Every evening | ORAL | 5 refills | Status: DC
Start: 2020-07-06 — End: 2021-05-17

## 2020-07-21 ENCOUNTER — Other Ambulatory Visit (INDEPENDENT_AMBULATORY_CARE_PROVIDER_SITE_OTHER): Payer: Self-pay

## 2020-07-21 MED ORDER — ROSUVASTATIN CALCIUM 20 MG PO TABS
20.0000 mg | ORAL_TABLET | Freq: Every day | ORAL | 3 refills | Status: DC
Start: 2020-07-21 — End: 2021-06-10

## 2020-07-21 NOTE — Progress Notes (Signed)
Called patient and advised of results and agrees to increase her crestor from 10mg  to 20mg  daily.  Patient would like to have new Crestor 20mg  RX sent to [facility].

## 2020-07-21 NOTE — Telephone Encounter (Signed)
Patient agrees to increase crestor from 10mg  to 20mg  per your lab result note.  Patient also requests copy of labs mailed to her home address.  I have put them in the mail for patient today, 07/21/20.    Stormy Card

## 2020-07-21 NOTE — Progress Notes (Signed)
Your lab results are generally stable but the cholesterol is a little more elevated.  I would recommend increasing the Crestor (rosuvastatin) from 10 mg once daily to 20 mg once daily.  If this is okay with you, then please let me know and I will send a new prescription to your pharmacy.

## 2020-08-27 ENCOUNTER — Other Ambulatory Visit (INDEPENDENT_AMBULATORY_CARE_PROVIDER_SITE_OTHER): Payer: Self-pay | Admitting: Pulmonary Disease

## 2020-10-13 IMAGING — CT CT HEAD W/O CM
4 series · 17 of 47 positions shown, 19 images · non-contrast
Comparison: None.

CLINICAL DATA: 77-year-old female altered mental status.

EXAM:
CT HEAD WITHOUT CONTRAST
TECHNIQUE: Contiguous axial images were obtained from the base of the skull
through the vertex without intravenous contrast.

[Series 3: head wo · axial · 0.41mm/px · z∈[+1220,+1346]mm · 7 of 35 slices shown, 9 images]
[im 5/35  brain]
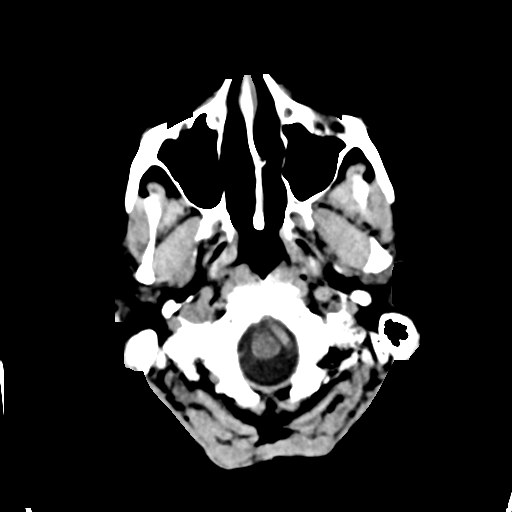
[im 5/35  bone]
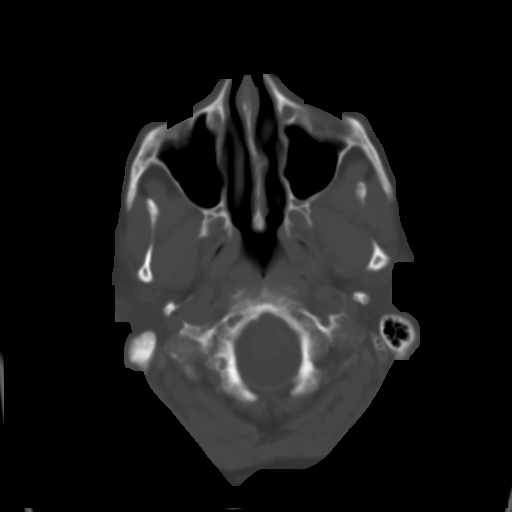
[im 9/35  brain]
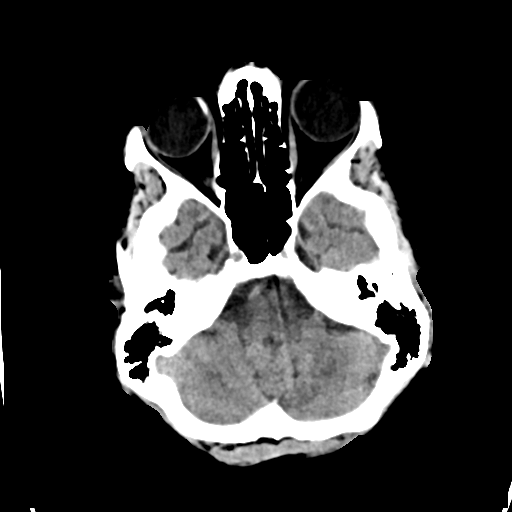
[im 13/35  brain]
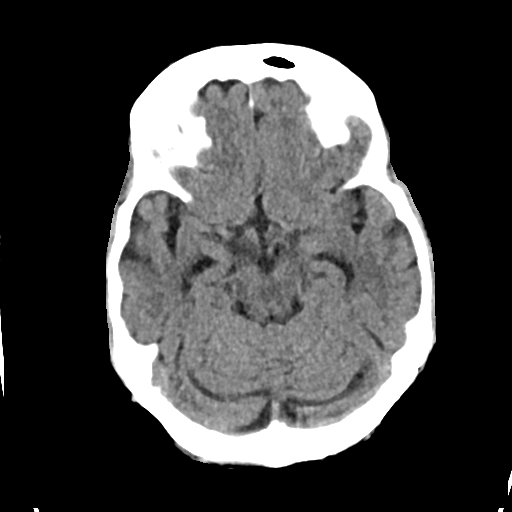
[im 18/35  brain]
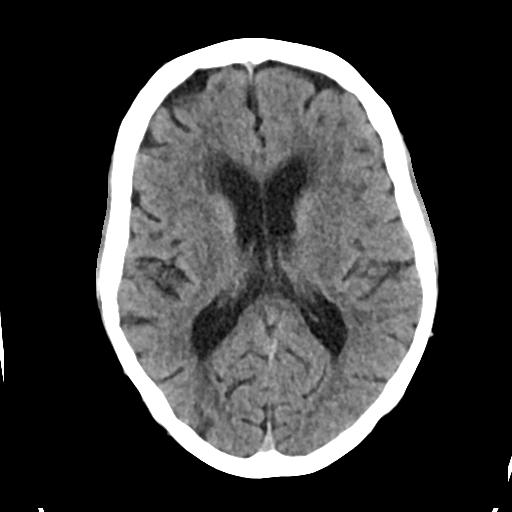
[im 22/35  brain]
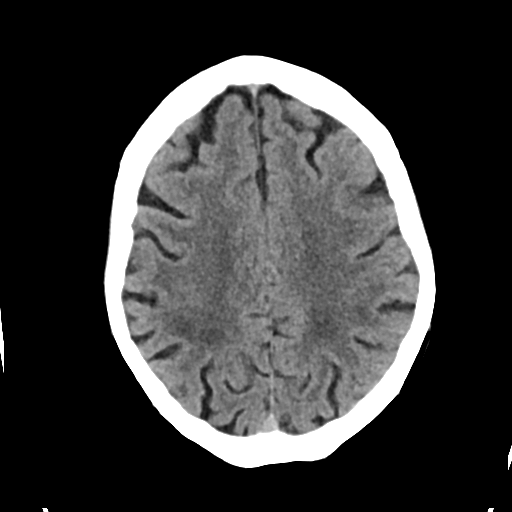
[im 22/35  bone]
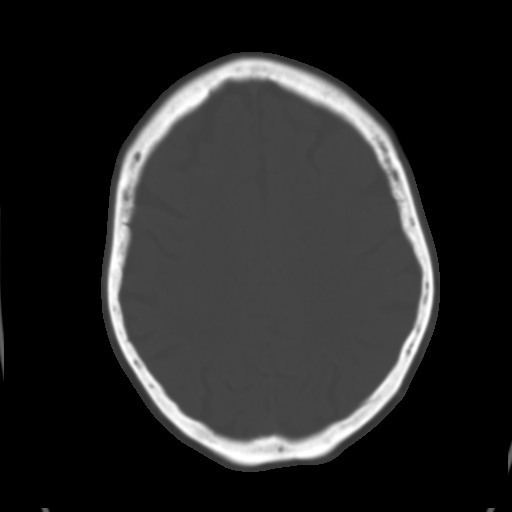
[im 26/35  brain]
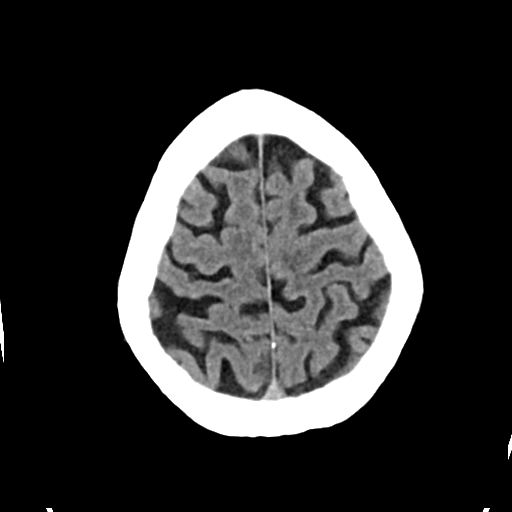
[im 30/35  brain]
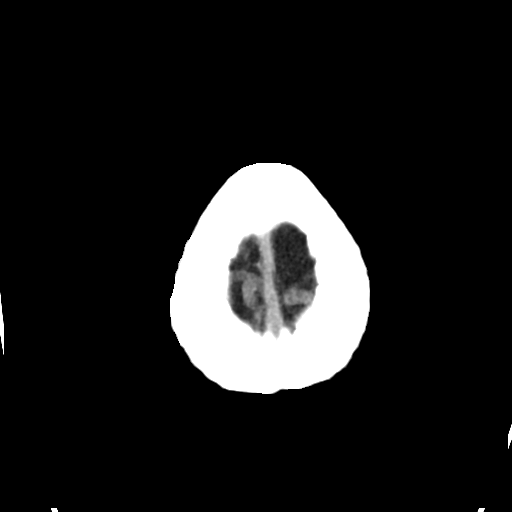

[Series 4: head bone · axial · 0.41mm/px · z∈[+1216,+1276]mm · 4 of 86 slices shown]
[im 9/86  bone]
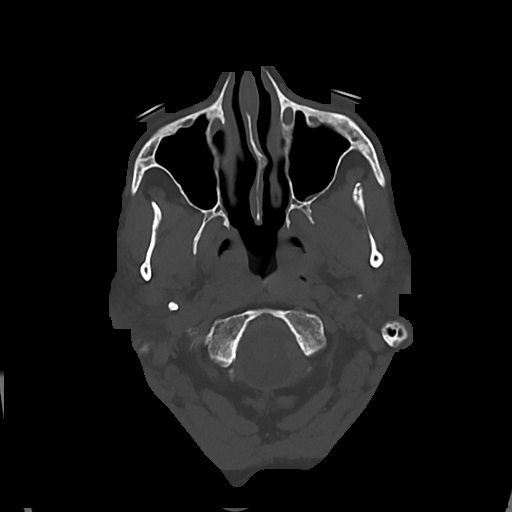
[im 18/86  bone]
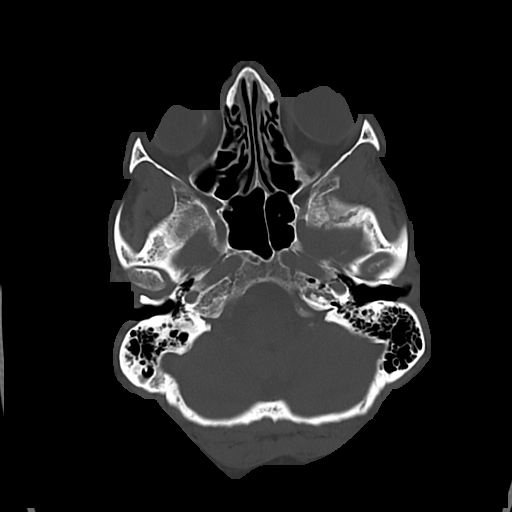
[im 26/86  bone]
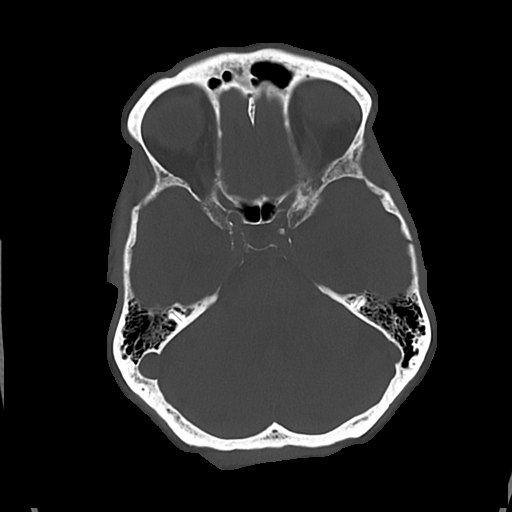
[im 39/86  bone]
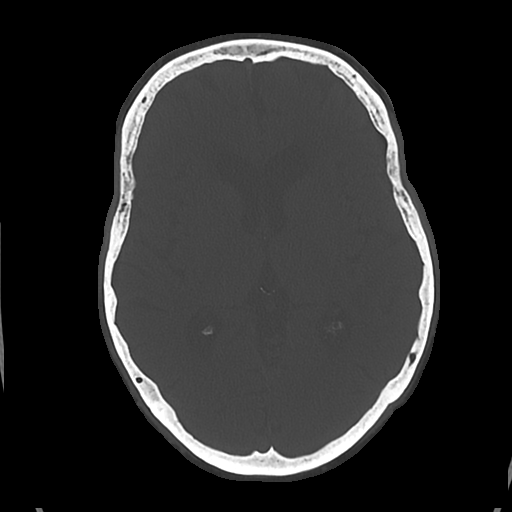

[Series 5: cor soft · coronal · 0.34mm/px · 3 of 66 slices shown]
[im 22/66  brain]
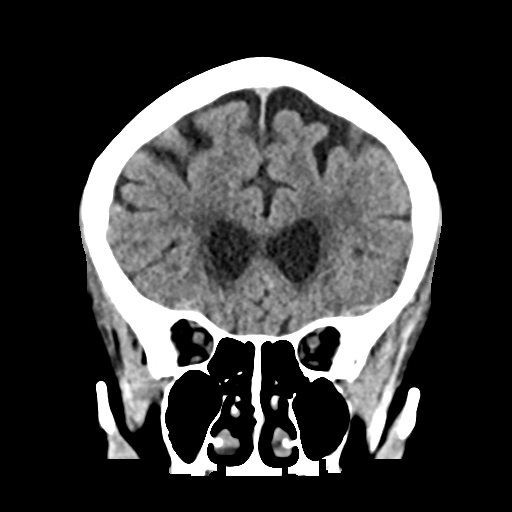
[im 29/66  brain]
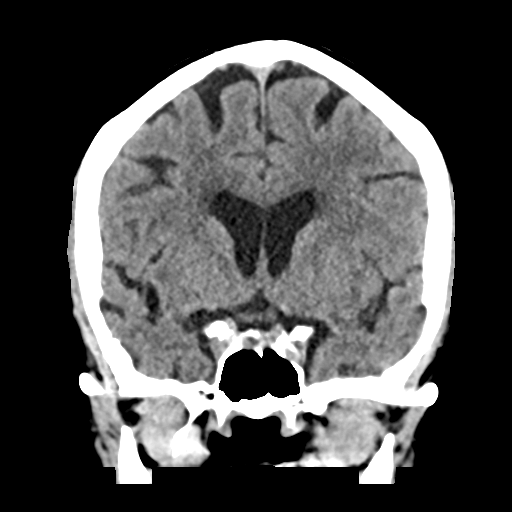
[im 37/66  brain]
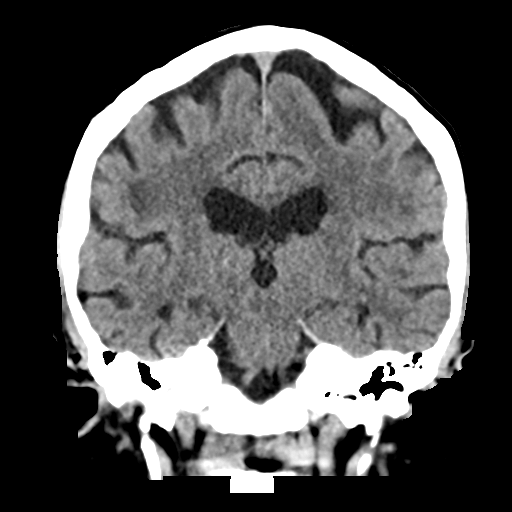

[Series 6: sag soft · sagittal · 0.35mm/px · 3 of 58 slices shown]
[im 20/58  brain]
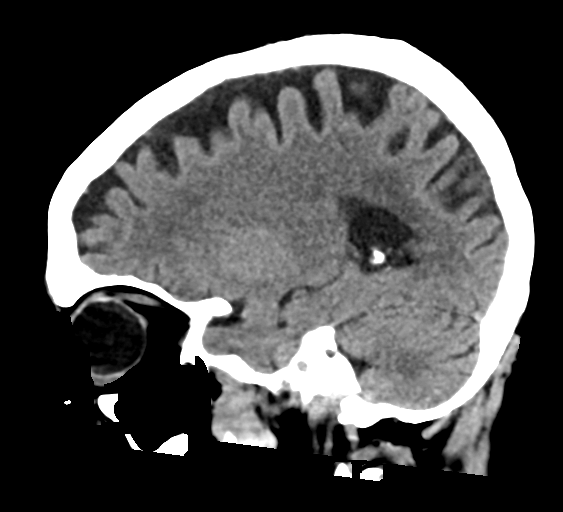
[im 29/58  brain]
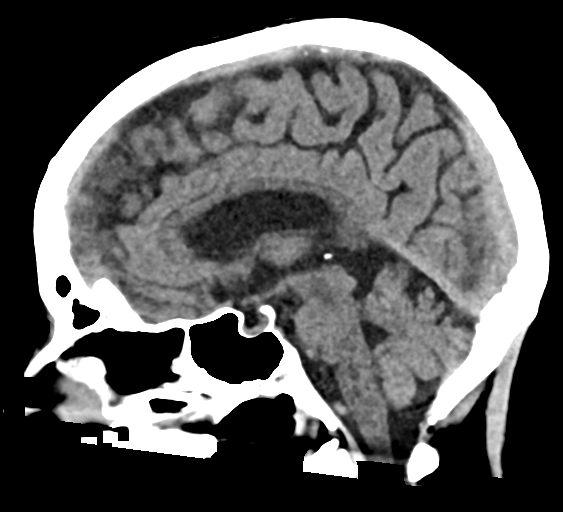
[im 39/58  brain]
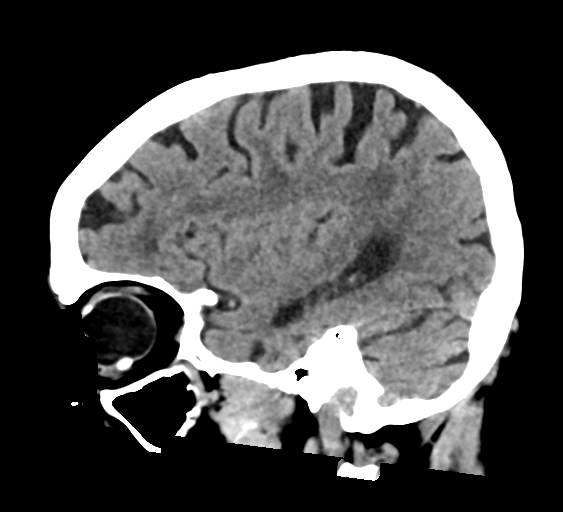

[17 of 47 positions shown; findings below may reference images not displayed]

FINDINGS: Brain: There is mild age-related atrophy and moderate chronic
microvascular ischemic changes. There is no acute intracranial
hemorrhage. No mass effect or shift. No extra-axial fluid
collection.

Vascular: No hyperdense vessel or unexpected calcification.

Skull: Normal. Negative for fracture or focal lesion.

Sinuses/Orbits: The visualized paranasal sinuses and mastoid air
cells are clear. Right scleral band.

Other: None
IMPRESSION: 1. No acute intracranial hemorrhage.
2. Age-related atrophy and chronic microvascular ischemic changes.

## 2020-12-09 ENCOUNTER — Emergency Department
Admission: EM | Admit: 2020-12-09 | Discharge: 2020-12-09 | Disposition: A | Discharge status: Home / Self Care | Payer: Medicare PPO | Attending: Nurse Practitioner | Admitting: Nurse Practitioner

## 2020-12-09 ENCOUNTER — Emergency Department: Payer: Medicare PPO

## 2020-12-09 DIAGNOSIS — S6392XA Sprain of unspecified part of left wrist and hand, initial encounter: Secondary | ICD-10-CM | POA: Insufficient documentation

## 2020-12-09 DIAGNOSIS — R42 Dizziness and giddiness: Secondary | ICD-10-CM | POA: Insufficient documentation

## 2020-12-09 DIAGNOSIS — S63502A Unspecified sprain of left wrist, initial encounter: Secondary | ICD-10-CM

## 2020-12-09 DIAGNOSIS — W108XXA Fall (on) (from) other stairs and steps, initial encounter: Secondary | ICD-10-CM | POA: Insufficient documentation

## 2020-12-09 DIAGNOSIS — S020XXA Fracture of vault of skull, initial encounter for closed fracture: Secondary | ICD-10-CM | POA: Insufficient documentation

## 2020-12-09 DIAGNOSIS — S0285XA Fracture of orbit, unspecified, initial encounter for closed fracture: Secondary | ICD-10-CM | POA: Insufficient documentation

## 2020-12-09 LAB — ECG 12-LEAD
P Wave Axis: 73 deg
P-R Interval: 170 ms
Patient Age: 79 years
Q-T Interval(Corrected): 480 ms
Q-T Interval: 476 ms
QRS Axis: 24 deg
QRS Duration: 109 ms
T Axis: 26 years
Ventricular Rate: 61 //min

## 2020-12-09 MED ORDER — MECLIZINE HCL 25 MG PO TABS
ORAL_TABLET | ORAL | Status: AC
Start: 2020-12-09 — End: ?
  Filled 2020-12-09: qty 1

## 2020-12-09 MED ORDER — MECLIZINE HCL 25 MG PO TABS
12.5000 mg | ORAL_TABLET | Freq: Four times a day (QID) | ORAL | 0 refills | Status: DC | PRN
Start: 2020-12-09 — End: 2021-05-17

## 2020-12-09 MED ORDER — MECLIZINE HCL 25 MG PO TABS
12.5000 mg | ORAL_TABLET | Freq: Once | ORAL | Status: AC
Start: 2020-12-09 — End: 2020-12-09
  Administered 2020-12-09: 12:00:00 12.5 mg via ORAL

## 2020-12-09 NOTE — ED Triage Notes (Signed)
Fell down 8 steps 2 nights ago and has injury to L frontal head. She has bruising to L eye and forehead. She has had dizziness since the fall. She denies LOC. She also c/o L wrist and elbow pain, but declines imaging of these.

## 2020-12-09 NOTE — ED Provider Notes (Signed)
[facility]  EMERGENCY DEPARTMENT  History and Physical Exam     Patient Name: [name], [name]  Encounter Date:  12/09/2020  Supervising Physician: Dow Adolph, MD   Treating Provider: Cathi Roan NP  Room:  RAUC/RAU-C  Patient DOB:  01-May-1941  Age: 80 y.o. female  MRN:  [id number]  PCP: Blake Divine, MD      Diagnosis/Disposition:  MDM:     Final Impression  1. Closed fracture of frontal bone, initial encounter    2. Vertigo    3. Closed fracture of orbit, initial encounter    4. Sprain of left wrist, initial encounter        Disposition  ED Disposition       ED Disposition   Discharge    Condition   --    Date/Time   Thu Dec 09, 2020 11:59 AM    Comment   [name] discharge to home/self care.    Condition at disposition: Stable                 Follow up  [facility]  [address]  [place] [address]  [phone]    As needed, If symptoms worsen    Duayne Cal, DDS  [address]  [place] [address]  [phone]    Schedule an appointment as soon as possible for a visit       Joelyn Oms, MD  [address]  [place] [address]  [phone]    Schedule an appointment as soon as possible for a visit       Prescriptions  New Prescriptions    No medications on file     ED Summary:    Differential diagnosis includes but is not limited to skull fracture, intracranial bleed, vertigo, orbital muscle entrapment, strain, sprain, fracture    Patient overall very well-appearing, incident happened 2 days ago.  She has been having persistent dizziness which prompted her visit.    On exam she has vertigo with nystagmus precipitated by head movement.  She is got left raccoon's eye.  She is found to have skull fracture involving left frontal left orbital roof and lateral orbital.    She denies vision changes although she notes maybe her left eye is slightly blurry.  No vision loss no evidence of extraocular treatment.    After discussion with  attending recommends neurosurgery and ophthalmology consult.  I discussed this and the findings with the patient.  Initially they declined both however they change their mind and are amenable to neurosurgery consult.  I called Dr. Renaldo Reel the neurosurgeon on-call after discussion and review he notes no further treatment needed based on normal neuro exam and image findings.    Antibiotics not routinely used per neurosurgeon after discussion with the patient they declined antibiotics.    Bed declined imaging of the left wrist and left elbow and she does have excellent range of motion no snuffbox tenderness.  No deformity or swelling.  She is ambulating well I have advised her on vertigo precautions.    Prescription for meclizine written.    Recommend follow-up with facial trauma, given neurosurgeon.  Discharged in excellent condition.  ED Course as of 12/09/20 1209   Thu Dec 09, 2020   1148 Paged neurosurgeon [JL]      ED Course User Index  [JL] Andoni Busch, Alric Seton, NP       At the time of disposition, after discussion with the  pt of all findings, considered differential and their implications, as well as signs of worsening, and their potential implications, patient/family verbalizes understanding and is amenable to treatment plan, and has no concerns at this time, all questions answered.      The patient's past medical records, including those in Care Everywhere when necessary, were reviewed by me    The results of diagnostic studies have been reviewed by myself. Available past medical, family, social, and surgical histories have been reviewed by myself. The clinical impression and plan have been discussed with the patient and/or the patient's family. All questions have been answered.      History of Presenting Illness:     Nurse Triage: c/o fall down 8 steps 2 days ago with ongoing dizziness. reports left wrist, left elbow, head injury. no blood thinners    Chief complaint: Fall        HPI/ROS given by: patient, unless  otherwise stated    [name] is a 80 y.o. female presenting with dizziness, headache.  She notes that 2 days ago she fell 8 steps.  She hit her left face around her eye.  She has slight blurriness but no vision loss.  No double vision.  She notes her wrist and elbow are sore but declines imaging.  She has been extremely dizzy since the fall.  She did not feel dizzy prior to.  She feels like the room is spinning when she gets dizzy it comes on suddenly.  Is worse with moving her head.        Review of Systems:  Physical Exam:     Review of Systems   Constitutional:  Negative for activity change.   HENT:  Negative for dental problem.    Eyes:  Negative for pain.   Respiratory:  Negative for shortness of breath.    Cardiovascular:  Negative for chest pain.   Gastrointestinal:  Negative for abdominal pain.   Musculoskeletal:  Negative for neck pain.   Skin:  Negative for wound.   Allergic/Immunologic: Negative for food allergies.   Neurological:  Positive for dizziness and headaches.   Psychiatric/Behavioral:  Negative for confusion.    Blood pressure 184/63, pulse 62, temperature 98.8 F (37.1 C), temperature source Oral, resp. rate 14, height 1.6 m, weight 66.7 kg, SpO2 98 %.     Physical Exam  Constitutional:       General: She is not in acute distress.     Appearance: She is well-developed.   HENT:      Head: Normocephalic. Raccoon eyes (Left) present.      Comments: Right ear cerumen impacted left ear unremarkable.    Extraocular movements intact.  Pupils brisk.  Eyes:      General: No scleral icterus.        Right eye: No discharge.         Left eye: No discharge.      Pupils: Pupils are equal, round, and reactive to light.   Neck:      Comments: Nontender, range of motion intact.  Does develop horizontal nystagmus with severe dizziness turning neck to the left  Cardiovascular:      Comments: Cap refill brisk, no appreciable peripheral edema  Pulmonary:      Effort: Pulmonary effort is normal. No  accessory muscle usage or respiratory distress.   Chest:      Comments: Nontender  Abdominal:      General: There is no distension.   Musculoskeletal:  General: No deformity. Normal range of motion.      Cervical back: Normal range of motion.   Skin:     General: Skin is warm and dry.      Findings: No erythema or rash.   Neurological:      Mental Status: She is alert and oriented to person, place, and time.           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    Surgical: Pt  has a past surgical history that includes Cataract extraction, bilateral; Dilation and curettage of uterus; Skin cancer excision (2013); Pyloroplasty; Retinal detachment surgery; Tonsillectomy and adenoidectomy; Tubal ligation; TUBAL LIGATION REVERSAL; and EGD (N/A, 11/10/2016).    Family: The family history includes Heart disease in her mother; No known problems in her father.    Social: Pt reports that she has quit smoking. She has never used smokeless tobacco. She reports current alcohol use. She reports that she does not use drugs.       Diagnostic Results:     RADIOLOGIC STUDIES    All images have been personally viewed by me    CT Head WO- Specify Condition/Reader    Result Date: 12/09/2020  Impression: 1.  There is a nondisplaced fracture along the anterolateral aspect of the left frontal bone with extension to the left orbital roof and lateral wall of the left orbit. A scalp hematoma is noted at the upper margin of the fracture line. No intracranial or hemorrhage is identified. 2.  Mild  prominence of the sulci and ventricles compatible with atrophy. Moderate chronic white matter hypoattenuation compatible with chronic small vessel ischemia. ReadingStation:[id number]     LAB STUDIES    All lab value have been personally reviewed by me    Results       ** No results found for the last 24 hours. **                EKG:    Reviewed at the time of assessment, or that it was made available to me.   EKG: Last EKG Result       Procedure Component Value Units Date/Time    ECG 12 lead [[id number]] Collected: 12/09/20 1133     Updated: 12/09/20 1135     Patient Age 4 years      Patient DOB [birth year]     Patient Height --     Patient Weight --     Interpretation Text --     Sinus rhythm  Low voltage, precordial leads  Compared to ECG 04/07/2019 08:46:18  Low QRS voltage now present       Physician Interpreter --     Ventricular Rate 61 //min      QRS Duration 109 ms      P-R Interval 170 ms      Q-T Interval 476 ms      Q-T Interval(Corrected) 480 ms      P Wave Axis 73 deg      QRS Axis 24 deg      T Axis 26 years           All EKG reviewed by an attending immediately  after completion per ED policy.     PROCEDURES          ORDERS PLACED THIS VISIT     Orders  Orders Placed This Encounter   Procedures    CT Head WO- Specify Condition/Reader    ECG 12 lead       Medications  Medications   meclizine (ANTIVERT) tablet 12.5 mg (12.5 mg Oral Given 12/09/20 1200)              Allergies & Medications:     Pt has No Known Allergies.    Current/Home Medications    BRIMONIDINE-TIMOLOL (COMBIGAN) 0.2-0.5 % OPHTHALMIC SOLUTION    Place 1 drop into both eyes 2 (two) times daily       DIOVAN HCT 160-25 MG PER TABLET    TAKE ONE TABLET BY MOUTH ONCE DAILY    DONEPEZIL (ARICEPT) 5 MG TABLET    Take 1 tablet (5 mg total) by mouth nightly    LATANOPROST (XALATAN) 0.005 % OPHTHALMIC SOLUTION    1 drop nightly.    MULTIPLE VITAMIN (MULTIVITAMIN) TABLET    Take 1 tablet by mouth daily.    PREDNISOLONE ACETATE (PRED FORTE) 1 %  OPHTHALMIC SUSPENSION    Place 1 drop into both eyes every other day       ROSUVASTATIN (CRESTOR) 20 MG TABLET    Take 1 tablet (20 mg total) by mouth daily    TRAVOPROST, BENZALKONIUM FREE, (TRAVATAN Z) 0.004 % OPHTHALMIC SOLUTION    Place 1 drop into both eyes nightly              ATTESTATIONS     Cathi Roan, NP    The results of diagnostic studies have been reviewed by myself. The above past medical, family, social, and surgical histories have been reviewed by myself. The clinical impression and plan have been discussed with the patient and/or the patient's family. All questions have been answered.    Note:  This chart was generated by an EMR and may contain errors, including typographical, or omissions not intended by the user. This chart was generated by the Epic EMR system/speech recognition and may contain inherent errors or omissions not intended by the user. Grammatical errors, random word insertions, deletions, pronoun errors and incomplete sentences are occasional consequences of this technology due to software limitations. Not all errors are caught or corrected. If there are questions or concerns about the content of this note or information contained within the body of this dictation they should be addressed directly with the author for clarification            Cleotis Nipper, NP  12/09/20 1209       Dow Adolph, MD  12/09/20 1445

## 2020-12-22 ENCOUNTER — Telehealth (INDEPENDENT_AMBULATORY_CARE_PROVIDER_SITE_OTHER): Payer: Self-pay | Admitting: Pulmonary Disease

## 2020-12-22 NOTE — Telephone Encounter (Signed)
Already evaluated in the emergency department on 12/09/20.

## 2020-12-22 NOTE — Telephone Encounter (Signed)
This patient husband asked if [facility] would have an order sent to get her wrist xrayed. Hshe had fallen about a day or two ago. Please check and advise. Reach patient back at husbands cell

## 2021-01-04 ENCOUNTER — Encounter (INDEPENDENT_AMBULATORY_CARE_PROVIDER_SITE_OTHER): Payer: Self-pay | Admitting: Pulmonary Disease

## 2021-01-04 ENCOUNTER — Ambulatory Visit (INDEPENDENT_AMBULATORY_CARE_PROVIDER_SITE_OTHER): Payer: Medicare PPO | Admitting: Pulmonary Disease

## 2021-01-04 VITALS — BP 168/90 | HR 69 | Resp 16 | Ht 62.0 in | Wt 137.0 lb

## 2021-01-04 DIAGNOSIS — I10 Essential (primary) hypertension: Secondary | ICD-10-CM

## 2021-01-04 DIAGNOSIS — E7800 Pure hypercholesterolemia, unspecified: Secondary | ICD-10-CM

## 2021-01-04 NOTE — Progress Notes (Signed)
CC     f/u essential HTN and hypercholesterolemia     HPI    During the last office visit she was having gradually worsening memory problems. She is no longer using the Donepezil and using Prevagen.  Workup negative.  Started on Donepezil 5 mg once daily.  Larey Seat and had closed fracture of the frontal bone after a fall.  No headaches.  Also injured left wrist with some discomfort on movement but full use of the wrist.  The blood pressure has been controlled as well as the cholesterol.  No chest pain or palpitations or unexplained dyspnea.  No abdominal complaints or change in bowel habits.  No edema.    PREVIOUS HX (07/06/20) Doing okay except for "my memory".  Frequently forgetful.  Loosing things and cannot remember when she placed things.  Able to cook and maintain personal hygiene.  Has tried Prevegen without improvement.  Father had dementia and lived into his 24's.  The blood pressure and the cholesterol have been controlled.    PREVIOUS HX (01/14/20) Continues to have intermittent dizziness which is slightly better.  Head movement worsens the symptoms.  No N/V.  No falls.  The recent CT Head was unremarkable.  The Meclizine has not been very helpful.  No headaches or visual changes.  The blood pressure has been controlled.    PREVIOUS HX (12/29/19) Onset of dizziness about 4-6 weeks ago.  When she changes posture especially leaning backward she develops a spinning sensation and nausea without vomiting.  Leaning head backward is the worse movement that she makes that precipitates the symptoms.  No sinus problems or recent URI's.  No headaches.  No change in hearing or tinnititis.  Symptoms have gradually worsening.  No change in vision.  No similar problems in the past.    ROS  Resp:   no unexplained dyspnea   CV:      no anginal-type chest pain  GI:       no dysphagia  Remainder of a 10-point ROS is negative.    PFSH  Medications and allergies reviewed and reconciled.  Problem list reviewed and updated.      ALLERGIES   No Known Allergies     MEDICATIONS   Current Outpatient Medications   Medication Instructions    brimonidine-timolol (Combigan) 0.2-0.5 % ophthalmic solution 1 drop, Both Eyes, 2 times daily    Diovan HCT 160-25 MG per tablet TAKE ONE TABLET BY MOUTH ONCE DAILY    donepezil (ARICEPT) 5 mg, Oral, At bedtime    latanoprost (XALATAN) 0.005 % ophthalmic solution 1 drop, Both Eyes, At bedtime    meclizine (ANTIVERT) 12.5 mg, Oral, Every 6 hours PRN    Multiple Vitamin (MULTIVITAMIN) tablet 1 tablet, Daily    rosuvastatin (CRESTOR) 20 mg, Oral, Daily    travoprost, benzalkonium free, (TRAVATAN Z) 0.004 % ophthalmic solution 1 drop, Both Eyes, At bedtime     MEDICAL PROBLEMS  Essential HTN   Hypercholesterolemia        statin therapy  H/o nausea and abdominal pain       CT Abd/Pelvis (628/18) left renal cortical cysts; NAD       EGD (11/13/16) negative  Memory problems       B12/folate (07/16/19) = 960/>20       TSH (07/06/20) = 3.30       CT Head (01/07/20) chronic small vessel ischemic change  Glaucoma  Chronic neuritis R thigh  Headaches       MRI  Brain (09/12/16) mild chronic microvascular changes       MRA Head (4/17/8) normal       MRA Neck (09/12/16) normal  Mild elevation of the uric acid       uric acid level (09/24/19) = 67.0  Osteopenia       DEXA scan (09/01/05) osteopenia       h/o bisphosphonate therapy  Left carpal tunnel syndrome  Ex-smoker  Screening colonoscopy; no fm h/o colon cancer       colonoscopy (09/11/07) hyperplastic polyp  Screening mammogram; no fm h/o breast cancer       fibrocystic breast disease       f/u Dr. Graciela Husbands (GYN)    SURGICAL HX  s/p repair pyloric stenosis; age 75  s/p tonsillectomy  s/p cataract extractions      Visit Vitals  BP 168/90   Pulse 69   Resp 16   Ht 1.575 m (5\' 2" )   Wt 62.1 kg (137 lb)   SpO2 98%   BMI 25.06 kg/m     EXAM   repeat blood pressure = 150/80  Gen/Psych:  well appearing; appropriate insight and affect   Neck:  Symmetrical; thyroid normal; no LN  enlargement; carotids full w/o bruits  Resp:  CTA/P; nrl resp effort; no chest wall deformity or tenderness  Heart:  RRR; no murmurs   Skin:  Warm and dry with normal turgor; no rashes  Ext:  No pedal edema; no digital cyanosis     DATA  Labs     CBC (07/06/20)   WBC = 7.1   HCT = 40.2   PLT = 285     CMP (07/06/20)   glu = 110   143/3.7/101/32   BUN/Cr =  17/0.75   LFT's = normal        Lipid Panel (07/06/20)   TC = 243     HDL = 60     LDL = 153     TG = 152     TSH (07/06/20) = 3.30       Uric Acid (09/24/19) = 6.9       CRP (09/24/19) = 0.30     ESR (09/24/19) = 10     ACE level (09/24/19) = 31     Lyme test (09/24/19) negative     ANCA (09/24/19) negative     Rheumatoid factor (09/24/19) 36.8     Quantiferone (09/24/19) negative    pCXR (04/07/19) normal chest.        ASSESSMENT    Memory problems.   Mild-to-moderate impairment.  On Prevagen.    Essential HTN.  Borderline elevated.  No change in medication.    Hypercholesterolemia.  Borderline controlled.    Osteopenia.    Borderline elevation of the uric without evidence of gout.      PLAN    No labs/test today.   Continue with the other medications without change.   Routine office f/u in 6 months.

## 2021-02-28 ENCOUNTER — Other Ambulatory Visit (INDEPENDENT_AMBULATORY_CARE_PROVIDER_SITE_OTHER): Payer: Self-pay

## 2021-02-28 MED ORDER — DIOVAN HCT 160-25 MG PO TABS
1.0000 | ORAL_TABLET | Freq: Every day | ORAL | 3 refills | Status: DC
Start: 2021-02-28 — End: 2021-07-07

## 2021-03-01 ENCOUNTER — Encounter (INDEPENDENT_AMBULATORY_CARE_PROVIDER_SITE_OTHER): Payer: Self-pay

## 2021-03-01 NOTE — Progress Notes (Signed)
Received a prior authorization request for Diovan HCT.  Completed prior authorization online at [email or web address] Key # [id number].  Pending approval or denial.

## 2021-03-02 NOTE — Progress Notes (Signed)
DIOVAN HCT PRIOR AUTH APPROVAL LETTER GOOD UNTIL 05/28/2022

## 2021-03-08 ENCOUNTER — Telehealth (INDEPENDENT_AMBULATORY_CARE_PROVIDER_SITE_OTHER): Payer: Self-pay | Admitting: Pulmonary Disease

## 2021-03-08 ENCOUNTER — Other Ambulatory Visit (INDEPENDENT_AMBULATORY_CARE_PROVIDER_SITE_OTHER): Payer: Self-pay | Admitting: Pulmonary Disease

## 2021-03-08 DIAGNOSIS — F03B0 Unspecified dementia, moderate, without behavioral disturbance, psychotic disturbance, mood disturbance, and anxiety: Secondary | ICD-10-CM

## 2021-03-08 NOTE — Telephone Encounter (Signed)
Patient is in the early stages of dimentia and she wants to know if there is a doctor that could help her? Is there a doctor that specializes in that?    Please advise.  Thanks!  [name] ---[phone]

## 2021-03-08 NOTE — Telephone Encounter (Signed)
Please let patient know that I will send a consult to neurology.

## 2021-03-09 NOTE — Telephone Encounter (Signed)
I called patients spouse and advised of referral being sent to Neurology.      Ann,   Please send referral per Dr. Artis Flock.    Thanks!

## 2021-05-17 ENCOUNTER — Ambulatory Visit (INDEPENDENT_AMBULATORY_CARE_PROVIDER_SITE_OTHER): Payer: Medicare PPO | Admitting: Pulmonary Disease

## 2021-05-17 ENCOUNTER — Encounter (INDEPENDENT_AMBULATORY_CARE_PROVIDER_SITE_OTHER): Payer: Self-pay | Admitting: Pulmonary Disease

## 2021-05-17 VITALS — BP 158/70 | HR 71 | Resp 16 | Ht 62.0 in | Wt 132.8 lb

## 2021-05-17 DIAGNOSIS — R42 Dizziness and giddiness: Secondary | ICD-10-CM

## 2021-05-17 MED ORDER — MECLIZINE HCL 25 MG PO TABS
25.0000 mg | ORAL_TABLET | Freq: Three times a day (TID) | ORAL | 11 refills | Status: DC | PRN
Start: 2021-05-17 — End: 2022-02-07

## 2021-05-17 NOTE — Progress Notes (Signed)
CC     f/u essential HTN and hypercholesterolemia     HPI    Larey Seat about a 10 days ago down three stair unto a concrete walk.  Hit the top of her head.  No bleeding   She was able to get up and the event was unwitnessed.  She was able walk back into the house she fell ago into the house.  No cuts or bleeding.  No LOC.  Did not remember event very well.  Dizziness when first getting out of bed in the morning.  Uses Meclizine which she feels that helps.     PREVIOUS HX (01/04/21) During the last office visit she was having gradually worsening memory problems. She is no longer using the Donepezil and using Prevagen.  Workup negative.  Started on Donepezil 5 mg once daily.  Larey Seat and had closed fracture of the frontal bone after a fall.  No headaches.  Also injured left wrist with some discomfort on movement but full use of the wrist.  The blood pressure has been controlled as well as the cholesterol.  No chest pain or palpitations or unexplained dyspnea.  No abdominal complaints or change in bowel habits.  No edema.    PREVIOUS HX (07/06/20) Doing okay except for "my memory".  Frequently forgetful.  Loosing things and cannot remember when she placed things.  Able to cook and maintain personal hygiene.  Has tried Prevegen without improvement.  Father had dementia and lived into his 65's.  The blood pressure and the cholesterol have been controlled.    PREVIOUS HX (01/14/20) Continues to have intermittent dizziness which is slightly better.  Head movement worsens the symptoms.  No N/V.  No falls.  The recent CT Head was unremarkable.  The Meclizine has not been very helpful.  No headaches or visual changes.  The blood pressure has been controlled.    PREVIOUS HX (12/29/19) Onset of dizziness about 4-6 weeks ago.  When she changes posture especially leaning backward she develops a spinning sensation and nausea without vomiting.  Leaning head backward is the worse movement that she makes that precipitates the symptoms.  No sinus  problems or recent URI's.  No headaches.  No change in hearing or tinnititis.  Symptoms have gradually worsening.  No change in vision.  No similar problems in the past.    ROS  Resp:   no unexplained dyspnea   CV:      no anginal-type chest pain  GI:       no dysphagia  Remainder of a 10-point ROS is negative.    PFSH  Medications and allergies reviewed and reconciled.  Problem list reviewed and updated.     ALLERGIES   No Known Allergies     MEDICATIONS   Current Outpatient Medications   Medication Instructions    Apoaequorin (PREVAGEN PO) Oral, Daily    brimonidine-timolol (Combigan) 0.2-0.5 % ophthalmic solution 1 drop, Both Eyes, Daily    Diovan HCT 160-25 MG per tablet 1 tablet, Oral, Daily    latanoprost (XALATAN) 0.005 % ophthalmic solution 1 drop, Both Eyes, At bedtime    meclizine (ANTIVERT) 12.5 mg, Oral, Every 6 hours PRN    Multiple Vitamin (MULTIVITAMIN) tablet 1 tablet, Daily    rosuvastatin (CRESTOR) 20 mg, Oral, Daily    travoprost, benzalkonium free, (TRAVATAN Z) 0.004 % ophthalmic solution 1 drop, Both Eyes, At bedtime     MEDICAL PROBLEMS  Essential HTN   Hypercholesterolemia  statin therapy  H/o nausea and abdominal pain       CT Abd/Pelvis (628/18) left renal cortical cysts; NAD       EGD (11/13/16) negative  Memory problems       B12/folate (07/16/19) = 960/>20       TSH (07/06/20) = 3.30       CT Head (01/07/20) chronic small vessel ischemic change  Glaucoma  Chronic neuritis R thigh  Headaches       MRI Brain (09/12/16) mild chronic microvascular changes       MRA Head (4/17/8) normal       MRA Neck (09/12/16) normal  Mild elevation of the uric acid       uric acid level (09/24/19) = 67.0  Osteopenia       DEXA scan (09/01/05) osteopenia       h/o bisphosphonate therapy  Left carpal tunnel syndrome  Ex-smoker  Screening colonoscopy; no fm h/o colon cancer       colonoscopy (09/11/07) hyperplastic polyp  Screening mammogram; no fm h/o breast cancer       fibrocystic breast disease       f/u Dr.  Graciela Husbands (GYN)    SURGICAL HX  s/p repair pyloric stenosis; age 56  s/p tonsillectomy  s/p cataract extractions      Visit Vitals  BP 158/70   Pulse 71   Resp 16   Ht 1.575 m (5\' 2" )   Wt 60.2 kg (132 lb 12.8 oz)   SpO2 99%   BMI 24.29 kg/m     EXAM    Gen/Psych:  well appearing; appropriate insight and affect   Neck:  Symmetrical; thyroid normal; no LN enlargement; carotids full w/o bruits  Resp:  CTA/P; nrl resp effort; no chest wall deformity or tenderness  Heart:  RRR; no murmurs   Skin:  Warm and dry with normal turgor; no rashes  Ext:  No pedal edema; no digital cyanosis     DATA  Labs     CBC (07/06/20)   WBC = 7.1   HCT = 40.2   PLT = 285     CMP (07/06/20)   glu = 110   143/3.7/101/32   BUN/Cr =  17/0.75   LFT's = normal        Lipid Panel (07/06/20)   TC = 243     HDL = 60     LDL = 153     TG = 152     TSH (07/06/20) = 3.30       Uric Acid (09/24/19) = 6.9       CRP (09/24/19) = 0.30     ESR (09/24/19) = 10     ACE level (09/24/19) = 31     Lyme test (09/24/19) negative     ANCA (09/24/19) negative     Rheumatoid factor (09/24/19) 36.8     Quantiferone (09/24/19) negative    pCXR (04/07/19) normal chest.      CT Head (12/09/20) 1.  There is a nondisplaced fracture along the anterolateral aspect of the left frontal bone with extension to the left orbital roof and lateral wall of the left orbit. A scalp hematoma is noted at the upper margin of the fracture line. No intracranial or hemorrhage is identified. 2.  Mild prominence of the sulci and ventricles compatible with atrophy. Moderate chronic white matter hypoattenuation compatible with chronic small vessel ischemia.      ASSESSMENT    Fall with blunt head  trauma.  More dizziness.  Improves with the Meclizine.  No further evaluation recommended.    Memory problems.   Mild-to-moderate impairment.  On Prevagen.    Essential HTN.  Borderline elevated.  No change in medication.    Hypercholesterolemia.  Borderline controlled.    Osteopenia.    Borderline elevation of the uric  without evidence of gout.      PLAN    No labs/test today.   Continue with the other medications without change.   Continue with routine office f/u.

## 2021-06-10 ENCOUNTER — Other Ambulatory Visit (INDEPENDENT_AMBULATORY_CARE_PROVIDER_SITE_OTHER): Payer: Self-pay

## 2021-06-10 MED ORDER — ROSUVASTATIN CALCIUM 20 MG PO TABS
20.0000 mg | ORAL_TABLET | Freq: Every day | ORAL | 3 refills | Status: AC
Start: 2021-06-10 — End: ?

## 2021-07-07 ENCOUNTER — Encounter (INDEPENDENT_AMBULATORY_CARE_PROVIDER_SITE_OTHER): Payer: Self-pay | Admitting: Pulmonary Disease

## 2021-07-07 ENCOUNTER — Other Ambulatory Visit (INDEPENDENT_AMBULATORY_CARE_PROVIDER_SITE_OTHER): Payer: Self-pay

## 2021-07-07 ENCOUNTER — Ambulatory Visit (INDEPENDENT_AMBULATORY_CARE_PROVIDER_SITE_OTHER): Payer: Medicare PPO | Admitting: Pulmonary Disease

## 2021-07-07 VITALS — BP 110/68 | HR 57 | Resp 16 | Ht 62.0 in | Wt 127.0 lb

## 2021-07-07 DIAGNOSIS — I10 Essential (primary) hypertension: Secondary | ICD-10-CM

## 2021-07-07 DIAGNOSIS — E7800 Pure hypercholesterolemia, unspecified: Secondary | ICD-10-CM

## 2021-07-07 LAB — COMPREHENSIVE METABOLIC PANEL
ALT: 9 U/L (ref 5.0–30.0)
AST (SGOT): 17 U/L (ref 0.0–40.0)
Albumin/Globulin Ratio: 1.88 Ratio (ref 0.80–2.00)
Albumin: 4.7 gm/dL (ref 3.5–5.3)
Alkaline Phosphatase: 39 U/L (ref 35.0–123.0)
Anion Gap: 16.9 mMol/L (ref 7.0–18.0)
BUN / Creatinine Ratio: 26.7 Ratio (ref 10.0–30.0)
BUN: 59 mg/dL — ABNORMAL HIGH (ref 8–24)
Bilirubin, Total: 0.8 mg/dL (ref 0.1–1.2)
CO2: 26 mMol/L (ref 22–33)
Calcium: 10.4 mg/dL — ABNORMAL HIGH (ref 8.0–10.3)
Chloride: 102.7 mMol/L (ref 98.0–106.0)
Creatinine: 2.21 mg/dL — ABNORMAL HIGH (ref 0.60–1.30)
EGFR: 22 mL/min/{1.73_m2} — ABNORMAL LOW (ref 60–150)
Globulin: 2.5 gm/dL (ref 2.0–4.0)
Glucose: 110 mg/dL (ref 65–110)
Osmolality Calculated: 300 mOsm/kg (ref 275–300)
Potassium: 3.8 mMol/L (ref 3.5–5.1)
Protein, Total: 7.2 gm/dL (ref 6.2–8.5)
Sodium: 141.8 mMol/L (ref 136.0–146.0)

## 2021-07-07 LAB — CBC AND DIFFERENTIAL
Absolute Immature Granulocytes: 0.01
Basophils %: 0.4 % (ref 0.1–1.2)
Basophils Absolute: 0.03 10*3/uL (ref 0.01–0.15)
Eosinophils %: 2.1 % (ref 0.7–5.8)
Eosinophils Absolute: 0.17 10*3/uL (ref 0.03–0.71)
Hematocrit: 38 % (ref 37.0–47.0)
Hemoglobin: 12.3 gm/dL (ref 12.0–16.0)
Immature Granulocytes: 0.1
Lymphocytes Absolute: 2.59 10*3/uL (ref 1.12–5.49)
Lymphocytes: 31.6 % (ref 22.9–44.6)
MCH: 31.7 pg (ref 27.2–33.3)
MCHC: 32.4 gm/dL (ref 31.9–33.9)
MCV: 97.9 fL (ref 81.0–101.0)
MPV: 9.6 fL (ref 9.4–12.3)
Monocytes Absolute: 0.68 10*3/uL (ref 0.23–1.54)
Monocytes: 8.3 % (ref 4.7–12.5)
Neutrophils %: 57.5 % (ref 34.0–71.1)
Neutrophils Absolute: 4.71 10*3/uL (ref 1.67–8.75)
PLT CT: 317 10*3/uL (ref 202.0–324.0)
RBC: 3.9 10*6/uL — ABNORMAL LOW (ref 4.2–6.3)
RDW CV: 14 % (ref 11.7–14.4)
RDW SD: 50.9 fL — ABNORMAL HIGH (ref 36.4–46.3)
WBC: 8.2 10*3/uL (ref 4.9–12.3)

## 2021-07-07 LAB — LIPID PANEL
Cholesterol: 197 mg/dL (ref 120.0–200.0)
Coronary Heart Disease Risk: 3.28
HDL: 60 mg/dL (ref 30.0–75.0)
LDL Calculated: 98 mg/dL (ref 66.0–130.0)
Triglycerides: 195 mg/dL — ABNORMAL HIGH (ref 36.0–150.0)
VLDL: 39 mg/dL — ABNORMAL HIGH (ref 6.5–24.0)

## 2021-07-07 MED ORDER — VALSARTAN-HYDROCHLOROTHIAZIDE 80-12.5 MG PO TABS
1.0000 | ORAL_TABLET | Freq: Every day | ORAL | 3 refills | Status: DC
Start: 2021-07-07 — End: 2021-07-11

## 2021-07-07 NOTE — Progress Notes (Signed)
CC     f/u essential HTN and hypercholesterolemia     HPI    C/o intermittent dizziness.  No falls recently.  No headaches.  No ear pain or sinus problems.  No spinning sensation.  Usually occurs when changing positions.  The blood pressure and the cholesterol have been controlled.  No chest pain or palpitations or unexplained dyspnea.  No abdominal complaints.  No edema.    PREVIOUS HX (05/17/21) Larey SeatFell about a 10 days ago down three stair unto a concrete walk.  Hit the top of her head.  No bleeding   She was able to get up and the event was unwitnessed.  She was able walk back into the house she fell ago into the house.  No cuts or bleeding.  No LOC.  Did not remember event very well.  Dizziness when first getting out of bed in the morning.  Uses Meclizine which she feels that helps.     PREVIOUS HX (01/04/21) During the last office visit she was having gradually worsening memory problems. She is no longer using the Donepezil and using Prevagen.  Workup negative.  Started on Donepezil 5 mg once daily.  Larey SeatFell and had closed fracture of the frontal bone after a fall.  No headaches.  Also injured left wrist with some discomfort on movement but full use of the wrist.  The blood pressure has been controlled as well as the cholesterol.  No chest pain or palpitations or unexplained dyspnea.  No abdominal complaints or change in bowel habits.  No edema.    PREVIOUS HX (07/06/20) Doing okay except for "my memory".  Frequently forgetful.  Loosing things and cannot remember when she placed things.  Able to cook and maintain personal hygiene.  Has tried Prevegen without improvement.  Father had dementia and lived into his 6490's.  The blood pressure and the cholesterol have been controlled.    ROS  Resp:   no unexplained dyspnea   CV:      no anginal-type chest pain  GI:       no dysphagia  Remainder of a 10-point ROS is negative.    PFSH  Medications and allergies reviewed and reconciled.  Problem list reviewed and updated.      ALLERGIES   No Known Allergies     MEDICATIONS   Current Outpatient Medications   Medication Instructions    Apoaequorin (PREVAGEN PO) Oral, Daily    brimonidine-timolol (Combigan) 0.2-0.5 % ophthalmic solution 1 drop, Both Eyes, Daily    Diovan HCT 160-25 MG per tablet 1 tablet, Oral, Daily    latanoprost (XALATAN) 0.005 % ophthalmic solution 1 drop, Both Eyes, At bedtime    meclizine (ANTIVERT) 25 mg, Oral, 3 times daily PRN    Multiple Vitamin (MULTIVITAMIN) tablet 1 tablet, Daily    rosuvastatin (CRESTOR) 20 mg, Oral, Daily    travoprost, benzalkonium free, (TRAVATAN Z) 0.004 % ophthalmic solution 1 drop, Both Eyes, At bedtime     MEDICAL PROBLEMS  Essential HTN   Hypercholesterolemia        statin therapy  H/o nausea and abdominal pain       CT Abd/Pelvis (628/18) left renal cortical cysts; NAD       EGD (11/13/16) negative  Dementia       B12/folate (07/16/19) = 960/>20       TSH (07/06/20) = 3.30       CT Head (01/07/20) chronic small vessel ischemic change  Glaucoma  Chronic neuritis R thigh  Headaches       MRI Brain (09/12/16) mild chronic microvascular changes       MRA Head (4/17/8) normal       MRA Neck (09/12/16) normal  Mild elevation of the uric acid       uric acid level (09/24/19) = 67.0  Osteopenia       DEXA scan (09/01/05) osteopenia       h/o bisphosphonate therapy  Left carpal tunnel syndrome  Ex-smoker  Screening colonoscopy; no fm h/o colon cancer       colonoscopy (09/11/07) hyperplastic polyp  Screening mammogram; no fm h/o breast cancer       fibrocystic breast disease       f/u Dr. Graciela Husbands (GYN)    SURGICAL HX  s/p repair pyloric stenosis; age 18  s/p tonsillectomy  s/p cataract extractions      Visit Vitals  BP 110/68   Pulse (!) 57   Resp 16   Ht 1.575 m (5\' 2" )   Wt 57.6 kg (127 lb)   SpO2 99%   BMI 23.23 kg/m     EXAM  125/90 sitting --> 115/75 standing  Gen/Psych:  well appearing; conversant but quiet  Neck:  Symmetrical; thyroid normal; no LN enlargement; carotids full w/o bruits  Resp:   CTA/P; nrl resp effort; no chest wall deformity or tenderness  Heart:  RRR; no murmurs   Skin:  Warm and dry with normal turgor; no rashes  Ext:  No pedal edema; no digital cyanosis     DATA  Labs     CBC (07/06/20)   WBC = 7.1   HCT = 40.2   PLT = 285     CMP (07/06/20)   glu = 110   143/3.7/101/32   BUN/Cr =  17/0.75   LFT's = normal        Lipid Panel (07/06/20)   TC = 243     HDL = 60     LDL = 153     TG = 152       TSH (07/06/20) = 3.30       Uric Acid (09/24/19) = 6.9       CRP (09/24/19) = 0.30     ESR (09/24/19) = 10     ACE level (09/24/19) = 31     Lyme test (09/24/19) negative     ANCA (09/24/19) negative     Rheumatoid factor (09/24/19) 36.8     Quantiferone (09/24/19) negative    pCXR (04/07/19) normal chest.      CT Head (12/09/20) 1.  There is a nondisplaced fracture along the anterolateral aspect of the left frontal bone with extension to the left orbital roof and lateral wall of the left orbit. A scalp hematoma is noted at the upper margin of the fracture line. No intracranial or hemorrhage is identified. 2.  Mild prominence of the sulci and ventricles compatible with atrophy. Moderate chronic white matter hypoattenuation compatible with chronic small vessel ischemia.      ASSESSMENT    Essential HTN.  Well controlled and possibly over controlled.  Plan to reduced the Diovan HCT from 160 to 80.    Hypercholesterolemia.  On Crestor 20 mg once daily.    Memory problems.   Mild-to-moderate impairment.  On Prevagen.    Osteopenia.    Borderline elevation of the uric without evidence of gout.      PLAN    Check CMP, CBC and Lipid Panel.  Reduce Diovan  HCT from 160 to 80 mg once daily.  Continue with the other medications without change.   Continue with routine office f/u.

## 2021-07-07 NOTE — Patient Instructions (Signed)
Reduce Diovan HCT from 160 to 80 mg once daily.

## 2021-07-11 ENCOUNTER — Other Ambulatory Visit (INDEPENDENT_AMBULATORY_CARE_PROVIDER_SITE_OTHER): Payer: Self-pay

## 2021-07-11 MED ORDER — DIOVAN HCT 80-12.5 MG PO TABS
1.0000 | ORAL_TABLET | Freq: Every day | ORAL | 3 refills | Status: AC
Start: 2021-07-11 — End: 2022-07-11

## 2021-07-14 ENCOUNTER — Other Ambulatory Visit (INDEPENDENT_AMBULATORY_CARE_PROVIDER_SITE_OTHER): Payer: Self-pay

## 2021-07-14 ENCOUNTER — Ambulatory Visit (INDEPENDENT_AMBULATORY_CARE_PROVIDER_SITE_OTHER): Payer: Medicare PPO

## 2021-07-14 ENCOUNTER — Other Ambulatory Visit (INDEPENDENT_AMBULATORY_CARE_PROVIDER_SITE_OTHER): Payer: Self-pay | Admitting: Pulmonary Disease

## 2021-07-14 DIAGNOSIS — J479 Bronchiectasis, uncomplicated: Secondary | ICD-10-CM

## 2021-07-14 DIAGNOSIS — R7989 Other specified abnormal findings of blood chemistry: Secondary | ICD-10-CM

## 2021-07-14 LAB — BASIC METABOLIC PANEL
Anion Gap: 16 mMol/L (ref 7.0–18.0)
BUN / Creatinine Ratio: 26.1 Ratio (ref 10.0–30.0)
BUN: 49 mg/dL — ABNORMAL HIGH (ref 8–24)
CO2: 28 mMol/L (ref 22–33)
Calcium: 10.3 mg/dL (ref 8.0–10.3)
Chloride: 102.7 mMol/L (ref 98.0–106.0)
Creatinine: 1.88 mg/dL — ABNORMAL HIGH (ref 0.60–1.30)
EGFR: 27 mL/min/{1.73_m2} — ABNORMAL LOW (ref 60–150)
Glucose: 146 mg/dL — ABNORMAL HIGH (ref 65–110)
Osmolality Calculated: 301 mOsm/kg — ABNORMAL HIGH (ref 275–300)
Potassium: 3.7 mMol/L (ref 3.5–5.1)
Sodium: 143 mMol/L (ref 136.0–146.0)

## 2021-07-14 NOTE — Progress Notes (Signed)
07/14/2021  Time: 1:10pm   Test: Basic  Patient Tolerance: good  Location Drawn: Right Arm

## 2021-08-23 ENCOUNTER — Encounter: Payer: Self-pay | Admitting: Physician Assistant

## 2021-08-23 DIAGNOSIS — A881 Epidemic vertigo: Secondary | ICD-10-CM

## 2021-08-23 DIAGNOSIS — H6120 Impacted cerumen, unspecified ear: Secondary | ICD-10-CM

## 2021-09-01 ENCOUNTER — Other Ambulatory Visit
Admission: RE | Admit: 2021-09-01 | Discharge: 2021-09-01 | Disposition: A | Discharge status: Home / Self Care | Payer: Medicare PPO | Source: Ambulatory Visit | Attending: Physician Assistant | Admitting: Physician Assistant

## 2021-09-01 LAB — BASIC METABOLIC PANEL
Anion Gap: 17.4 mMol/L (ref 7.0–18.0)
BUN / Creatinine Ratio: 25 Ratio (ref 10.0–30.0)
BUN: 28 mg/dL — ABNORMAL HIGH (ref 7–22)
CO2: 25 mMol/L (ref 20–30)
Calcium: 10.1 mg/dL (ref 8.5–10.5)
Chloride: 105 mMol/L (ref 98–110)
Creatinine: 1.12 mg/dL (ref 0.60–1.20)
EGFR: 50 mL/min/{1.73_m2} — ABNORMAL LOW (ref 60–150)
Glucose: 98 mg/dL (ref 71–99)
Osmolality Calculated: 292 mOsm/kg (ref 275–300)
Potassium: 3.4 mMol/L — ABNORMAL LOW (ref 3.5–5.3)
Sodium: 144 mMol/L (ref 136–147)

## 2021-09-01 LAB — HEMOGLOBIN A1C
Estimated Average Glucose: 114 mg/dL
Hgb A1C, %: 5.6 %

## 2021-09-01 LAB — VITAMIN B12 AND FOLATE
Folate: 17 ng/mL (ref 7.0–19.9)
Vitamin B-12: 666 pg/mL (ref 213–816)

## 2021-09-03 ENCOUNTER — Ambulatory Visit (INDEPENDENT_AMBULATORY_CARE_PROVIDER_SITE_OTHER)
Admission: RE | Admit: 2021-09-03 | Discharge: 2021-09-03 | Disposition: A | Discharge status: Home / Self Care | Payer: Medicare PPO | Source: Ambulatory Visit | Attending: Physician Assistant | Admitting: Physician Assistant

## 2021-09-03 DIAGNOSIS — H6120 Impacted cerumen, unspecified ear: Secondary | ICD-10-CM

## 2021-09-03 DIAGNOSIS — A881 Epidemic vertigo: Secondary | ICD-10-CM

## 2021-09-29 ENCOUNTER — Ambulatory Visit: Payer: Medicare PPO | Attending: Physician Assistant

## 2021-09-29 DIAGNOSIS — H81393 Other peripheral vertigo, bilateral: Secondary | ICD-10-CM | POA: Insufficient documentation

## 2021-10-20 NOTE — Progress Notes (Signed)
CC     f/u essential HTN and hypercholesterolemia     HPI    Feels well and no new complaints.  No unexplained dyspnea, anginal-type chest pain or palpitations.  No abdominal complaints, change in bowel habits or blood in the stool/melena.  No change in urinary pattern.  No claudication or lower extremity edema.      PREVIOUS HX (07/07/21) C/o intermittent dizziness.  No falls recently.  No headaches.  No ear pain or sinus problems.  No spinning sensation.  Usually occurs when changing positions.  The blood pressure and the cholesterol have been controlled.  No chest pain or palpitations or unexplained dyspnea.  No abdominal complaints.  No edema.    PREVIOUS HX (05/17/21) Larey SeatFell about a 10 days ago down three stair unto a concrete walk.  Hit the top of her head.  No bleeding   She was able to get up and the event was unwitnessed.  She was able walk back into the house she fell ago into the house.  No cuts or bleeding.  No LOC.  Did not remember event very well.  Dizziness when first getting out of bed in the morning.  Uses Meclizine which she feels that helps.     PREVIOUS HX (01/04/21) During the last office visit she was having gradually worsening memory problems. She is no longer using the Donepezil and using Prevagen.  Workup negative.  Started on Donepezil 5 mg once daily.  Larey SeatFell and had closed fracture of the frontal bone after a fall.  No headaches.  Also injured left wrist with some discomfort on movement but full use of the wrist.  The blood pressure has been controlled as well as the cholesterol.  No chest pain or palpitations or unexplained dyspnea.  No abdominal complaints or change in bowel habits.  No edema.    PREVIOUS HX (07/06/20) Doing okay except for "my memory".  Frequently forgetful.  Loosing things and cannot remember when she placed things.  Able to cook and maintain personal hygiene.  Has tried Prevegen without improvement.  Father had dementia and lived into his 7590's.  The blood pressure and the  cholesterol have been controlled.    ROS  Resp:   no unexplained dyspnea   CV:      no anginal-type chest pain  GI:       no dysphagia  Remainder of a 10-point ROS is negative.    PFSH  Medications and allergies reviewed and reconciled.  Problem list reviewed and updated.     ALLERGIES   No Known Allergies     MEDICATIONS   Current Outpatient Medications   Medication Instructions    Apoaequorin (PREVAGEN PO) Oral, Daily    brimonidine-timolol (Combigan) 0.2-0.5 % ophthalmic solution 1 drop, Both Eyes, Daily    Diovan HCT 80-12.5 MG per tablet 1 tablet, Oral, Daily    latanoprost (XALATAN) 0.005 % ophthalmic solution 1 drop, Both Eyes, At bedtime    meclizine (ANTIVERT) 25 mg, Oral, 3 times daily PRN    Multiple Vitamin (MULTIVITAMIN) tablet 1 tablet, Oral, Daily    rosuvastatin (CRESTOR) 20 mg, Oral, Daily    travoprost, benzalkonium free, (TRAVATAN Z) 0.004 % ophthalmic solution 1 drop, Both Eyes, At bedtime     MEDICAL PROBLEMS  Essential HTN   Hypercholesterolemia        statin therapy  H/o nausea and abdominal pain       CT Abd/Pelvis (628/18) left renal cortical cysts; NAD  EGD (11/13/16) negative  Dementia       B12/folate (07/16/19) = 960/>20       TSH (07/06/20) = 3.30       CT Head (01/07/20) chronic small vessel ischemic change  Glaucoma  Chronic neuritis R thigh  Headaches       MRI Brain (09/12/16) mild chronic microvascular changes       MRA Head (4/17/8) normal       MRA Neck (09/12/16) normal  Mild elevation of the uric acid       uric acid level (09/24/19) = 67.0  Osteopenia       DEXA scan (09/01/05) osteopenia       h/o bisphosphonate therapy  Left carpal tunnel syndrome  Ex-smoker  Screening colonoscopy; no fm h/o colon cancer       colonoscopy (09/11/07) hyperplastic polyp  Screening mammogram; no fm h/o breast cancer       fibrocystic breast disease       f/u Dr. Graciela Husbands (GYN)    SURGICAL HX  s/p repair pyloric stenosis; age 41  s/p tonsillectomy  s/p cataract extractions      Visit Vitals  BP 140/80    Pulse 63   Resp 16   Ht 1.575 m (5\' 2" )   Wt 58.9 kg (129 lb 14.4 oz)   SpO2 98%   BMI 23.76 kg/m     EXAM   Gen/Psych:  well appearing; conversant but quiet  Neck:  Symmetrical; thyroid normal; no LN enlargement; carotids full w/o bruits  Resp:  CTA/P; nrl resp effort; no chest wall deformity or tenderness  Heart:  RRR; no murmurs   Skin:  Warm and dry  Ext:  No pedal edema; no digital cyanosis     DATA  Labs     CBC (07/07/21)   WBC = 8.2   HCT = 38.0   PLT = 317     BMP (09/01/21)   glu = 98   144/3.4/105/25   BUN/Cr =  28/1.13       Lipid Panel (07/07/21)   TC = 197     HDL = 60     LDL = 98    TG = 195       TSH (07/06/20) = 3.30        B12/folate (08/31/21) = 666/17       Uric Acid (09/24/19) = 6.9         CRP (09/24/19) = 0.30     ESR (09/24/19) = 10     ACE level (09/24/19) = 31     Lyme test (09/24/19) negative     ANCA (09/24/19) negative     Rheumatoid factor (09/24/19) 36.8     Quantiferone (09/24/19) negative    pCXR (04/07/19) normal chest.      CT Head (09/03/21) 1.  No acute intracranial abnormality. 2.  Moderate chronic white matter microangiopathic changes and volume loss.      ASSESSMENT    Essential HTN.  Controlled.    Hypercholesterolemia.  On Crestor 20 mg once daily.    Memory problems.   Mild-to-moderate impairment.     Osteopenia.    Borderline elevation of the uric.   Uric Acid (09/24/19) = 6.9   No h/o symptomatic gout.      PLAN    No labs/test today.   Continue with the other medications without change.   Routine f/u with 09/26/19 in 4 months.

## 2021-10-21 ENCOUNTER — Encounter (INDEPENDENT_AMBULATORY_CARE_PROVIDER_SITE_OTHER): Payer: Self-pay | Admitting: Pulmonary Disease

## 2021-10-21 ENCOUNTER — Ambulatory Visit (INDEPENDENT_AMBULATORY_CARE_PROVIDER_SITE_OTHER): Payer: Medicare PPO | Admitting: Pulmonary Disease

## 2021-10-21 VITALS — BP 140/80 | HR 63 | Resp 16 | Ht 62.0 in | Wt 129.9 lb

## 2021-10-21 DIAGNOSIS — R7989 Other specified abnormal findings of blood chemistry: Secondary | ICD-10-CM

## 2021-10-21 DIAGNOSIS — E7800 Pure hypercholesterolemia, unspecified: Secondary | ICD-10-CM

## 2021-10-27 ENCOUNTER — Ambulatory Visit: Payer: Medicare PPO

## 2021-11-04 ENCOUNTER — Encounter (INDEPENDENT_AMBULATORY_CARE_PROVIDER_SITE_OTHER): Payer: Medicare PPO | Admitting: Pulmonary Disease

## 2021-11-26 ENCOUNTER — Ambulatory Visit: Payer: Medicare PPO

## 2022-02-07 ENCOUNTER — Other Ambulatory Visit (INDEPENDENT_AMBULATORY_CARE_PROVIDER_SITE_OTHER): Payer: Self-pay

## 2022-02-07 ENCOUNTER — Ambulatory Visit (INDEPENDENT_AMBULATORY_CARE_PROVIDER_SITE_OTHER): Payer: Medicare PPO | Admitting: Family

## 2022-02-07 ENCOUNTER — Encounter (INDEPENDENT_AMBULATORY_CARE_PROVIDER_SITE_OTHER): Payer: Self-pay | Admitting: Family

## 2022-02-07 VITALS — BP 120/60 | HR 43 | Temp 98.3°F | Resp 16 | Ht 62.0 in | Wt 138.8 lb

## 2022-02-07 DIAGNOSIS — Z23 Encounter for immunization: Secondary | ICD-10-CM

## 2022-02-07 DIAGNOSIS — I10 Essential (primary) hypertension: Secondary | ICD-10-CM

## 2022-02-07 DIAGNOSIS — E785 Hyperlipidemia, unspecified: Secondary | ICD-10-CM

## 2022-02-07 DIAGNOSIS — F0390 Unspecified dementia without behavioral disturbance: Secondary | ICD-10-CM

## 2022-02-07 LAB — BASIC METABOLIC PANEL
Anion Gap: 14.9 mMol/L (ref 7.0–18.0)
BUN / Creatinine Ratio: 26.3 Ratio (ref 10.0–30.0)
BUN: 21 mg/dL (ref 8–24)
CO2: 27 mMol/L (ref 22–33)
Calcium: 9.8 mg/dL (ref 8.0–10.3)
Chloride: 103.5 mMol/L (ref 98.0–106.0)
Creatinine: 0.8 mg/dL (ref 0.60–1.30)
EGFR: 74 mL/min/{1.73_m2} (ref 60–150)
Glucose: 95 mg/dL (ref 65–110)
Osmolality Calculated: 285 mOsm/kg (ref 275–300)
Potassium: 4 mMol/L (ref 3.5–5.1)
Sodium: 141.4 mMol/L (ref 136.0–146.0)

## 2022-02-07 LAB — CBC AND DIFFERENTIAL
Absolute Immature Granulocytes: 0.01
Basophils %: 0.6 % (ref 0.1–1.2)
Basophils Absolute: 0.05 10*3/uL (ref 0.01–0.15)
Eosinophils %: 1.9 % (ref 0.7–5.8)
Eosinophils Absolute: 0.15 10*3/uL (ref 0.03–0.71)
Hematocrit: 40.7 % (ref 37.0–47.0)
Hemoglobin: 13.1 gm/dL (ref 12.0–16.0)
Immature Granulocytes: 0.1
Lymphocytes Absolute: 2.6 10*3/uL (ref 1.12–5.49)
Lymphocytes: 33 % (ref 22.9–44.6)
MCH: 31.7 pg (ref 27.2–33.3)
MCHC: 32.2 gm/dL (ref 31.9–33.9)
MCV: 98.5 fL (ref 81.0–101.0)
MPV: 9.9 fL (ref 9.4–12.3)
Monocytes Absolute: 0.58 10*3/uL (ref 0.23–1.54)
Monocytes: 7.4 % (ref 4.7–12.5)
Neutrophils %: 57 % (ref 34.0–71.1)
Neutrophils Absolute: 4.5 10*3/uL (ref 1.67–8.75)
PLT CT: 319 10*3/uL (ref 202.0–324.0)
RBC: 4.1 10*6/uL — ABNORMAL LOW (ref 4.2–6.3)
RDW CV: 13.1 % (ref 11.7–14.4)
RDW SD: 48.5 fL — ABNORMAL HIGH (ref 36.4–46.3)
WBC: 7.9 10*3/uL (ref 4.9–12.3)

## 2022-02-07 LAB — HEPATIC FUNCTION PANEL
ALT: 13 U/L (ref 5.0–30.0)
AST (SGOT): 27 U/L (ref 0.0–40.0)
Albumin/Globulin Ratio: 1.92 Ratio (ref 0.80–2.00)
Albumin: 4.6 gm/dL (ref 3.5–5.3)
Alkaline Phosphatase: 46 U/L (ref 35.0–123.0)
Bilirubin Direct: 0.3 mg/dL (ref 0.0–0.5)
Bilirubin, Total: 1 mg/dL (ref 0.1–1.2)
Globulin: 2.4 gm/dL (ref 2.0–4.0)
Protein, Total: 7 gm/dL (ref 6.2–8.5)

## 2022-02-07 LAB — LIPID PANEL
Cholesterol: 226 mg/dL — ABNORMAL HIGH (ref 120.0–200.0)
Coronary Heart Disease Risk: 3.01
HDL: 75 mg/dL (ref 30.0–75.0)
LDL Calculated: 120.2 mg/dL (ref 66.0–130.0)
Triglycerides: 154 mg/dL — ABNORMAL HIGH (ref 36.0–150.0)
VLDL: 30.8 mg/dL — ABNORMAL HIGH (ref 6.5–24.0)

## 2022-02-07 LAB — TSH: TSH: 2.35 u[IU]/mL (ref 0.50–5.80)

## 2022-02-07 NOTE — Progress Notes (Signed)
Patient:   [name]                                                  CSN:        [id number]                                          DOB:       December 03, 1940                                                    MRN:        [id number]   NEW PATIENT    SUBJECTIVE     Chief Complaint: est care       Patient Care Team.Patient Providers: Patient Care Team:  Betsy Coder, NP as PCP - General (Nurse Practitioner)    HPI: Patient is a 81 y.o. female who comes in to establish care.  She lives with her husband.  Says she has a good appetite.  No problems with sleep.  Previously followed with Dr. Sheppard Penton for primary care.  Past medical history includes hypertension, hyperlipidemia, dementia, glaucoma, headaches, chronic neuritis right thigh, hyperuricemia, osteopenia.  Denies concerns for today.    Hypertension-on Diovan, hydrochlorothiazide.  Does not check her blood pressure at home on a regular basis.    Hyperlipidemia on rosuvastatin.  Denies any muscle cramping or achiness    Dementia-feels like she has had a steady decline in her memory.  She is not sure if she follows with a neurologist.  Husband provides additional medical history and states that she has seen neurologist a few times.  She is not on any medication.  She feels like she does not have any problems with her memory.  Husband states that she has trouble following directions at for example cannot follow a recipe.  This did cause some discord during the visit.    Glaucoma-follows with ophthalmologist on a regular basis.    History uricemia.  Denies any history of gout.  She is on HCTZ    Headaches-no recent issues    Osteopenia-previously on biphosphonate's.      I have reviewed the new patient intake form with personal history, available previous medical records including but not limited to previous PCP office notes, hospitalizations, diagnostics including labs, imaging, micro and pathology and consultations.      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    Cough 10/24/2012    Disorder of bone and cartilage, unspecified 10/24/2012    Essential hypertension, benign 10/24/2012    Open angle with borderline findings, low risk 10/24/2012    Other and unspecified hyperlipidemia 10/24/2012    Personal history of colonic polyps 10/24/2012    Routine gynecological examination 10/24/2012         Past Surgical History:   Procedure Laterality Date    CATARACT EXTRACTION, BILATERAL      WITH LENS INSERTION    DILATION AND CURETTAGE OF UTERUS  MISCARRIAGE    EGD N/A 11/10/2016    Procedure: EGD;  Surgeon: Chauncey FischerKitchin, Llewellyn I, MD;  [facility];  Service: Gastroenterology;  Laterality: N/A;    PYLOROPLASTY      PYLORIC STENOSIS, 606 WEEKS OLD    RETINAL DETACHMENT SURGERY      SKIN CANCER EXCISION  2013    BACK    TONSILLECTOMY AND ADENOIDECTOMY      TUBAL LIGATION      TUBAL LIGATION REVERSAL          Family History   Problem Relation Age of Onset    Heart disease Mother     No known problems Father        She  reports that she has quit smoking. She has never used smokeless tobacco. She reports current alcohol use. She reports that she does not use drugs.     Immunization History   Administered Date(s) Administered    COVID-19 mRNA BIVALENT vaccine 12 years and above (Moderna) 50 mcg/0.5 mL 04/01/2021    COVID-19 mRNA MONOVALENT vaccine PRIMARY SERIES 12 years and above (Moderna) 100 mcg/0.5 mL 06/19/2019, 07/16/2019, 04/02/2020    INFLUENZA HIGH DOSE 65 YRS+ 02/25/2020, 03/08/2021    Influenza quad 6 MOS to 64 YRS (Flulaval/Fluarix) 02/07/2022    Novel Influenza-H1N1-09, injectable 04/30/2008    Td 01/13/2019    Tdap 02/27/2012    Zoster [facility](SHINGRIX) Vaccine Recombinant 03/07/2017, 06/12/2017       No Known Allergies      Current Outpatient Medications:     Apoaequorin (PREVAGEN PO), Take by mouth daily, Disp: , Rfl:     brimonidine-timolol (Combigan) 0.2-0.5 % ophthalmic  solution, Place 1 drop into both eyes daily, Disp: , Rfl:     Diovan HCT 80-12.5 MG per tablet, Take 1 tablet by mouth daily, Disp: 90 tablet, Rfl: 3    Multiple Vitamin (MULTIVITAMIN) tablet, Take 1 tablet by mouth daily, Disp: , Rfl:     rosuvastatin (CRESTOR) 20 MG tablet, Take 1 tablet (20 mg) by mouth daily, Disp: 90 tablet, Rfl: 3    travoprost, benzalkonium free, (TRAVATAN Z) 0.004 % ophthalmic solution, Place 1 drop into both eyes nightly, Disp: , Rfl:       Review of Systems:     Review of Systems   Constitutional:  Negative for chills, fever and unexpected weight change.   HENT:  Negative for ear pain, sinus pressure and sore throat.    Eyes:  Negative for visual disturbance.   Respiratory:  Negative for cough, shortness of breath and wheezing.    Cardiovascular:  Negative for chest pain and palpitations.   Gastrointestinal:  Negative for abdominal pain, diarrhea, nausea and vomiting.   Genitourinary:  Negative for dysuria and flank pain.   Neurological:  Negative for dizziness and syncope.   Psychiatric/Behavioral:  Negative for confusion.        PHYSICAL EXAM     BP 120/60 (BP Site: Left arm, Patient Position: Sitting, Cuff Size: Large)   Pulse (!) 43   Temp 98.3 F (36.8 C) (Oral)   Resp 16   Ht 1.575 m (5\' 2" )   Wt 63 kg (138 lb 12.8 oz)   SpO2 99%   BMI 25.39 kg/m     Physical Exam  Constitutional:       Appearance: Normal appearance.   HENT:      Head: Normocephalic and atraumatic.      Right Ear: External ear normal.      Left  Ear: External ear normal.      Nose: Nose normal.      Mouth/Throat:      Mouth: Mucous membranes are moist.      Pharynx: Oropharynx is clear.   Eyes:      Extraocular Movements: Extraocular movements intact.      Conjunctiva/sclera: Conjunctivae normal.   Cardiovascular:      Rate and Rhythm: Normal rate and regular rhythm.      Pulses: Normal pulses.      Heart sounds: Normal heart sounds.   Pulmonary:      Effort: Pulmonary effort is normal.      Breath sounds:  Normal breath sounds.   Abdominal:      General: Abdomen is flat.      Palpations: Abdomen is soft.   Musculoskeletal:         General: Normal range of motion.      Cervical back: Normal range of motion.   Skin:     General: Skin is warm and dry.   Neurological:      General: No focal deficit present.      Mental Status: Mental status is at baseline.   Psychiatric:         Mood and Affect: Mood normal.         ASSESSMENT and PLAN     1. Need for immunization against influenza    2. Essential hypertension, benign    3. Dementia, unspecified dementia severity, unspecified dementia type, unspecified whether behavioral, psychotic, or mood disturbance or anxiety    4. Hyperlipidemia, unspecified hyperlipidemia type      PLAN:    Hypertension well-controlled.  Continue Diovan, hydrochlorothiazide  Glucoma-follow-up with ophthalmology  Hyperlipidemia-continue Crestor  Fasting labs ordered  Consider Prevnar 20 at next visit  Recheck bradycardia noted-heart rate 60 during visit today.  Denies any dizziness.  Obtain notes from neurology regarding dementia  Follow-up in 6 months or earlier if needed    Betsy Coder, NP    This chart was generated by the Epic EMR system/speech recognition and may contain inherent errors or omissions not intended by the user. Grammatical errors, random word insertions, deletions, pronoun errors and incomplete sentences are occasional consequences of this technology due to software limitations. Not all errors are caught or corrected. If there are questions or concerns about the content of this note or information contained within the body of this dictation they should be addressed directly with the author for clarification

## 2022-08-08 ENCOUNTER — Encounter (INDEPENDENT_AMBULATORY_CARE_PROVIDER_SITE_OTHER): Payer: Medicare PPO | Admitting: Family
